# Patient Record
Sex: Female | Born: 1960 | Race: White | Hispanic: No | Smoking: Former smoker
Health system: Southern US, Community
[De-identification: ages and names within clinical notes are randomized; demographics above are authoritative.]

## PROBLEM LIST (undated history)

## (undated) DIAGNOSIS — M549 Dorsalgia, unspecified: Secondary | ICD-10-CM

## (undated) DIAGNOSIS — K760 Fatty (change of) liver, not elsewhere classified: Secondary | ICD-10-CM

## (undated) DIAGNOSIS — F32A Depression, unspecified: Secondary | ICD-10-CM

## (undated) DIAGNOSIS — M1990 Unspecified osteoarthritis, unspecified site: Secondary | ICD-10-CM

## (undated) DIAGNOSIS — N921 Excessive and frequent menstruation with irregular cycle: Secondary | ICD-10-CM

## (undated) DIAGNOSIS — F419 Anxiety disorder, unspecified: Secondary | ICD-10-CM

## (undated) DIAGNOSIS — N951 Menopausal and female climacteric states: Secondary | ICD-10-CM

## (undated) DIAGNOSIS — L918 Other hypertrophic disorders of the skin: Secondary | ICD-10-CM

## (undated) DIAGNOSIS — B019 Varicella without complication: Secondary | ICD-10-CM

## (undated) DIAGNOSIS — B079 Viral wart, unspecified: Secondary | ICD-10-CM

## (undated) DIAGNOSIS — Z5189 Encounter for other specified aftercare: Secondary | ICD-10-CM

## (undated) DIAGNOSIS — K219 Gastro-esophageal reflux disease without esophagitis: Secondary | ICD-10-CM

## (undated) DIAGNOSIS — F329 Major depressive disorder, single episode, unspecified: Secondary | ICD-10-CM

## (undated) DIAGNOSIS — J189 Pneumonia, unspecified organism: Secondary | ICD-10-CM

## (undated) DIAGNOSIS — E049 Nontoxic goiter, unspecified: Secondary | ICD-10-CM

## (undated) DIAGNOSIS — L570 Actinic keratosis: Secondary | ICD-10-CM

## (undated) DIAGNOSIS — R51 Headache: Secondary | ICD-10-CM

## (undated) DIAGNOSIS — R011 Cardiac murmur, unspecified: Secondary | ICD-10-CM

## (undated) DIAGNOSIS — R252 Cramp and spasm: Secondary | ICD-10-CM

## (undated) HISTORY — DX: Depression, unspecified: F32.A

## (undated) HISTORY — DX: Menopausal and female climacteric states: N95.1

## (undated) HISTORY — DX: Unspecified osteoarthritis, unspecified site: M19.90

## (undated) HISTORY — DX: Fatty (change of) liver, not elsewhere classified: K76.0

## (undated) HISTORY — DX: Actinic keratosis: L57.0

## (undated) HISTORY — DX: Dorsalgia, unspecified: M54.9

## (undated) HISTORY — DX: Cramp and spasm: R25.2

## (undated) HISTORY — DX: Other hypertrophic disorders of the skin: L91.8

## (undated) HISTORY — DX: Anxiety disorder, unspecified: F41.9

## (undated) HISTORY — DX: Viral wart, unspecified: B07.9

## (undated) HISTORY — DX: Excessive and frequent menstruation with irregular cycle: N92.1

## (undated) HISTORY — DX: Nontoxic goiter, unspecified: E04.9

## (undated) HISTORY — DX: Varicella without complication: B01.9

## (undated) HISTORY — DX: Major depressive disorder, single episode, unspecified: F32.9

## (undated) HISTORY — DX: Encounter for other specified aftercare: Z51.89

---
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
## 1993-03-31 HISTORY — PX: FOOT SURGERY: SHX648

## 1998-03-31 HISTORY — PX: TUBAL LIGATION: SHX77

## 2005-11-07 ENCOUNTER — Ambulatory Visit: Payer: Self-pay | Admitting: Internal Medicine

## 2005-12-17 ENCOUNTER — Other Ambulatory Visit: Admission: RE | Admit: 2005-12-17 | Discharge: 2005-12-17 | Payer: Self-pay | Admitting: Obstetrics and Gynecology

## 2006-01-09 ENCOUNTER — Encounter: Admission: RE | Admit: 2006-01-09 | Discharge: 2006-01-09 | Payer: Self-pay | Admitting: Obstetrics and Gynecology

## 2006-04-02 ENCOUNTER — Ambulatory Visit: Payer: Self-pay | Admitting: Internal Medicine

## 2006-06-30 ENCOUNTER — Ambulatory Visit: Payer: Self-pay | Admitting: Internal Medicine

## 2007-12-21 ENCOUNTER — Ambulatory Visit: Payer: Self-pay | Admitting: Internal Medicine

## 2008-01-05 DIAGNOSIS — N951 Menopausal and female climacteric states: Secondary | ICD-10-CM

## 2008-01-05 DIAGNOSIS — N921 Excessive and frequent menstruation with irregular cycle: Secondary | ICD-10-CM

## 2008-01-05 HISTORY — DX: Excessive and frequent menstruation with irregular cycle: N92.1

## 2008-01-05 HISTORY — DX: Menopausal and female climacteric states: N95.1

## 2008-02-03 ENCOUNTER — Ambulatory Visit: Payer: Self-pay | Admitting: Family Medicine

## 2008-02-03 DIAGNOSIS — F411 Generalized anxiety disorder: Secondary | ICD-10-CM | POA: Insufficient documentation

## 2008-02-03 DIAGNOSIS — F32A Depression, unspecified: Secondary | ICD-10-CM | POA: Insufficient documentation

## 2008-02-03 DIAGNOSIS — F419 Anxiety disorder, unspecified: Secondary | ICD-10-CM

## 2008-02-03 DIAGNOSIS — F329 Major depressive disorder, single episode, unspecified: Secondary | ICD-10-CM

## 2008-03-01 ENCOUNTER — Ambulatory Visit: Payer: Self-pay | Admitting: Internal Medicine

## 2008-05-10 ENCOUNTER — Ambulatory Visit: Payer: Self-pay | Admitting: Internal Medicine

## 2008-05-11 ENCOUNTER — Telehealth: Payer: Self-pay | Admitting: Internal Medicine

## 2008-05-11 ENCOUNTER — Ambulatory Visit: Payer: Self-pay | Admitting: Internal Medicine

## 2008-10-27 ENCOUNTER — Ambulatory Visit: Payer: Self-pay | Admitting: Internal Medicine

## 2008-10-30 ENCOUNTER — Encounter: Payer: Self-pay | Admitting: Internal Medicine

## 2008-11-06 ENCOUNTER — Ambulatory Visit: Payer: Self-pay | Admitting: Family Medicine

## 2008-11-06 LAB — CONVERTED CEMR LAB: Rapid Strep: NEGATIVE

## 2009-01-11 ENCOUNTER — Telehealth (INDEPENDENT_AMBULATORY_CARE_PROVIDER_SITE_OTHER): Payer: Self-pay | Admitting: *Deleted

## 2009-04-06 ENCOUNTER — Encounter (INDEPENDENT_AMBULATORY_CARE_PROVIDER_SITE_OTHER): Payer: Self-pay | Admitting: *Deleted

## 2009-04-06 ENCOUNTER — Telehealth (INDEPENDENT_AMBULATORY_CARE_PROVIDER_SITE_OTHER): Payer: Self-pay | Admitting: *Deleted

## 2009-04-06 ENCOUNTER — Telehealth: Payer: Self-pay | Admitting: Internal Medicine

## 2009-04-19 ENCOUNTER — Ambulatory Visit: Payer: Self-pay | Admitting: Internal Medicine

## 2009-07-06 ENCOUNTER — Telehealth (INDEPENDENT_AMBULATORY_CARE_PROVIDER_SITE_OTHER): Payer: Self-pay | Admitting: *Deleted

## 2009-09-03 ENCOUNTER — Ambulatory Visit: Payer: Self-pay | Admitting: Internal Medicine

## 2009-09-03 DIAGNOSIS — M549 Dorsalgia, unspecified: Secondary | ICD-10-CM | POA: Insufficient documentation

## 2009-09-25 ENCOUNTER — Encounter: Payer: Self-pay | Admitting: Internal Medicine

## 2009-10-23 ENCOUNTER — Encounter: Payer: Self-pay | Admitting: Internal Medicine

## 2009-10-31 ENCOUNTER — Encounter: Payer: Self-pay | Admitting: Internal Medicine

## 2009-11-23 ENCOUNTER — Telehealth: Payer: Self-pay | Admitting: Internal Medicine

## 2009-12-29 ENCOUNTER — Emergency Department (HOSPITAL_COMMUNITY): Admission: EM | Admit: 2009-12-29 | Discharge: 2009-12-29 | Payer: Self-pay | Admitting: Emergency Medicine

## 2009-12-31 ENCOUNTER — Ambulatory Visit: Payer: Self-pay | Admitting: Internal Medicine

## 2009-12-31 DIAGNOSIS — T07XXXA Unspecified multiple injuries, initial encounter: Secondary | ICD-10-CM | POA: Insufficient documentation

## 2010-01-14 ENCOUNTER — Telehealth: Payer: Self-pay | Admitting: Internal Medicine

## 2010-01-21 ENCOUNTER — Ambulatory Visit (HOSPITAL_COMMUNITY): Admission: RE | Admit: 2010-01-21 | Discharge: 2010-01-21 | Payer: Self-pay | Admitting: Sports Medicine

## 2010-01-22 ENCOUNTER — Telehealth: Payer: Self-pay | Admitting: Internal Medicine

## 2010-02-07 ENCOUNTER — Encounter: Payer: Self-pay | Admitting: Internal Medicine

## 2010-02-08 ENCOUNTER — Telehealth (INDEPENDENT_AMBULATORY_CARE_PROVIDER_SITE_OTHER): Payer: Self-pay | Admitting: *Deleted

## 2010-03-21 ENCOUNTER — Encounter: Payer: Self-pay | Admitting: Internal Medicine

## 2010-04-30 NOTE — Consult Note (Signed)
Summary: MRI-spinal stenosis, discussed options, plans a local injection  [facility]   Imported By: Lanelle Bal 11/07/2009 11:20:41  _____________________________________________________________________  External Attachment:    Type:   Image     Comment:   External Document

## 2010-04-30 NOTE — Consult Note (Signed)
Summary: RX MRI, back---[facility]  [facility]   Imported By: Lanelle Bal 10/12/2009 08:30:54  _____________________________________________________________________  External Attachment:    Type:   Image     Comment:   External Document

## 2010-04-30 NOTE — Letter (Signed)
Summary: Primary Care Appointment Letter  [facility] [place]  [address] [place], [place] [address]   Phone: [phone]  Fax: [phone]    04/06/2009 MRN: [id number]  [name] [name] [address] [place], [place]  [address]  Dear [name],   Your Primary Care Physician Kennebec E. Paz MD has indicated that:    ___X____it is time to schedule an appointment.    _______you missed your appointment on______ and need to call and          reschedule.    _______you need to have lab work done.    _______you need to schedule an appointment discuss lab or test results.    _______you need to call to reschedule your appointment that is                       scheduled on _________.     Please call our office as soon as possible. Our phone number is [phone]          _________. Please press option 1. Our office is open 8a-12noon and 1p-5p, Monday through Friday.     Thank you,     Primary Care Scheduler

## 2010-04-30 NOTE — Assessment & Plan Note (Signed)
Summary: 6 mos followup/med   Vital Signs:  Patient profile:   50 year old female Height:      64.5 inches Weight:      192.6 pounds Pulse rate:   60 / minute BP sitting:   110 / 80  Vitals Entered By: Shary Decamp (April 19, 2009 9:27 AM) CC: rov   History of Present Illness: ROV doing well emotionally   Allergies: 1)  ! * Almonds  Past History:  Past Medical History: Reviewed history from 05/10/2008 and no changes required. G1 P1 ANXIETY -DEPRESSIVE DISORDER     Past Surgical History: Reviewed history from 12/21/2007 and no changes required. Tubal ligation (2000) left foot surgery tracheostomy (1968) due to severe allergic rxn  Social History: Married 1 child 50 y/o Occupation: lost her job 03/2009  Tobacco--quit 2008 ETOH-- social   Review of Systems       sleeping well off birth control pills per her gynecologist, having mild hot flashes at night  Physical Exam  General:  alert and well-developed.   Lungs:  normal respiratory effort, no intercostal retractions, no accessory muscle use, and normal breath sounds.   Heart:  normal rate, regular rhythm, and no murmur.   Extremities:  no edema   Impression & Recommendations:  Problem # 1:  ANXIETY (ICD-300.00) doing well, stopping her birth control pills has not affect her mood no change return to the office in 6 to 8 months Her updated medication list for this problem includes:    Ativan 1 Mg Tabs (Lorazepam) .Marland Kitchen... 1/2-1 tab by mouth as needed for anxiety -    Fluoxetine Hcl 20 Mg Caps (Fluoxetine hcl) .Marland Kitchen... 1 by mouth once daily -  Problem # 2:  ROUTINE GENERAL MEDICAL EXAM@HEALTH  CARE FACL (ICD-V70.0) her gynecologist takes  care of her  physicals last Td ? within last 10 years  no flu shot-- explained the benefits, will call if interested  Complete Medication List: 1)  Ativan 1 Mg Tabs (Lorazepam) .... 1/2-1 tab by mouth as needed for anxiety - 2)  Fluoxetine Hcl 20 Mg Caps (Fluoxetine  hcl) .Marland Kitchen.. 1 by mouth once daily - 3)  Ketoconazole 2 % Crea (Ketoconazole) .... Ketoconazole 2% cream apply bid x7days  Patient Instructions: 1)  Please schedule a follow-up appointment in 6 to 8  months .

## 2010-04-30 NOTE — Consult Note (Signed)
Summary: [facility]  [facility]   Imported By: Lanelle Bal 02/20/2010 12:50:50  _____________________________________________________________________  External Attachment:    Type:   Image     Comment:   External Document

## 2010-04-30 NOTE — Progress Notes (Signed)
Summary: refill request  Phone Note Refill Request Message from:  Patient on November 23, 2009 11:49 AM  Refills Requested: Medication #1:  ATIVAN 1 MG TABS 1/2-1 tab by mouth as needed for anxiety -   Dosage confirmed as above?Dosage Confirmed   Supply Requested: 1 month [facility] [address]  Next Appointment Scheduled: none Initial call taken by: Lavell Islam,  November 23, 2009 11:49 AM  Follow-up for Phone Call        ok, see prescription Follow-up by: [facility] E. Paz MD,  November 23, 2009 4:41 PM    New/Updated Medications: ATIVAN 1 MG TABS (LORAZEPAM) 1/2-1 tab by mouth by mouth once a day  as needed for anxiety Prescriptions: ATIVAN 1 MG TABS (LORAZEPAM) 1/2-1 tab by mouth by mouth once a day  as needed for anxiety  #30 x 0   Entered by:   Army Fossa CMA   Authorized by:   Nolon Rod. Paz MD   Signed by:   Army Fossa CMA on 11/23/2009   Method used:   Printed then faxed to ...       [facility] [address] [phone]* (retail)       [address]       [place], [place]  [address]       Ph: [phone]       Fax: [phone]   RxID:   [id number]   Appended Document: refill request sent [address]

## 2010-04-30 NOTE — Assessment & Plan Note (Signed)
Summary: pain hip- leg/cbs   Vital Signs:  Patient profile:   51 year old female Height:      64.5 inches Weight:      196 pounds BMI:     33.24 Temp:     98.2 degrees F oral Pulse rate:   62 / minute BP sitting:   120 / 78  (left arm)  Vitals Entered By: Jeremy Johann CMA (September 03, 2009 3:35 PM) CC: pain in lef x65month Comments REVIEWED MED LIST, PATIENT AGREED DOSE AND INSTRUCTION CORRECT    History of Present Illness: has a long h/o mild back pain, pain increased since January along with on and off radiation to the left leg. The pain in the leg is posterior, going all the way from the hip to the heel, burning-like. at the present time, the back pain has decreased but the pain in the posterior left leg is more steady. Initially stretching and naproxen helped  Allergies: 1)  ! * Almonds  Past History:  Past Medical History: Reviewed history from 05/10/2008 and no changes required. G1 P1 ANXIETY -DEPRESSIVE DISORDER     Past Surgical History: Reviewed history from 12/21/2007 and no changes required. Tubal ligation (2000) left foot surgery tracheostomy (1968) due to severe allergic rxn  Social History: Reviewed history from 04/19/2009 and no changes required. Married 1 child   Occupation: lost her job 03/2009  Tobacco--quit 2008 ETOH-- social   Review of Systems       no fever No bladder or bowel incontinence No rash in the buttocks  Physical Exam  General:  alert and well-developed.   Extremities:  no lower extremity edema Neurologic:  lower extremity pinprick examination, reflexes and strength: Normal Straight leg test negative Gait normal   Impression & Recommendations:  Problem # 1:  BACK PAIN (ICD-724.5)  chronic on and off back pain, worse for the last few months with some radicular features. The patient is not interested in pain medication "I just like the pain to get better" plan: Orthopedic referral  Orders: Orthopedic Referral  (Ortho)  Complete Medication List: 1)  Ativan 1 Mg Tabs (Lorazepam) .... 1/2-1 tab by mouth as needed for anxiety - 2)  Fluoxetine Hcl 20 Mg Caps (Fluoxetine hcl) .Marland Kitchen.. 1 by mouth once daily - 3)  Ketoconazole 2 % Crea (Ketoconazole) .... Ketoconazole 2% cream apply bid x7days

## 2010-04-30 NOTE — Op Note (Signed)
Summary: Lumbar Epidural Steroid Injection/[facility]   Lumbar Epidural Steroid Injection/[facility]   Imported By: Lanelle Bal 11/12/2009 13:42:50  _____________________________________________________________________  External Attachment:    Type:   Image     Comment:   External Document

## 2010-04-30 NOTE — Progress Notes (Signed)
Summary: Pain in legs./kb  Phone Note Call from Patient Call back at Home Phone [phone]   Summary of Call: Patient left message on triage that she was seen and was advised to give her pain sometime. She states that she is still having a considerable amount of pain in her legs and would like to know if she should be seen here again or referred back to ortho. Please advise. Initial call taken by: Lucious Groves CMA,  January 14, 2010 9:51 AM  Follow-up for Phone Call        refer her back to the ortho she saw in the past Jose E. Paz MD  January 14, 2010 10:04 AM   Additional Follow-up for Phone Call Additional follow up Details #1::        Patient notified and will call their office. Additional Follow-up by: Lucious Groves CMA,  January 14, 2010 11:01 AM
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
## 2010-04-30 NOTE — Progress Notes (Signed)
Summary: FLUOXETINE HCL REFILL  Phone Note Refill Request Message from:  Fax from Pharmacy on February 08, 2010 1:01 PM  Refills Requested: Medication #1:  FLUOXETINE HCL 20 MG CAPS 1 by mouth once daily -   Last Refilled: 01/08/2010 [facility],   [address], FAX [phone]   QTY = 30  Initial call taken by: Jerolyn Shin,  February 08, 2010 1:03 PM    Prescriptions: FLUOXETINE HCL 20 MG CAPS (FLUOXETINE HCL) 1 by mouth once daily -  #30 x 3   Entered by:   Army Fossa CMA   Authorized by:   Nolon Rod. Paz MD   Signed by:   Army Fossa CMA on 02/08/2010   Method used:   Electronically to        [facility] [address] (retail)       [phone]       [place], [place]  [address]       Ph: [phone]       Fax: [phone]   RxID:   [id number]

## 2010-04-30 NOTE — Progress Notes (Signed)
  Phone Note Refill Request Message from:  Patient on April 06, 2009 4:32 PM  Refills Requested: Medication #1:  FLUOXETINE HCL 20 MG CAPS 1 by mouth once daily  Medication #2:  ATIVAN 1 MG TABS 1/2-1 tab by mouth as needed for anxiety   Last Refilled: 02/03/2008   Notes: #30 Patient requesting 90 day supply of fluoxetine because she will be losing insurance @ the end of this month.  Patient states that her ativan rx has expired.  Ok'd #90 on fluoxetine & #30 on ativan, pt is due ov....will make pt aware   [facility] [address] Next Appointment Scheduled: no pending appt Initial call taken by: Shary Decamp,  April 06, 2009 4:35 PM  Follow-up for Phone Call        agree Follow-up by: [facility] E. Dharma Pare MD,  April 06, 2009 4:43 PM    New/Updated Medications: ATIVAN 1 MG TABS (LORAZEPAM) 1/2-1 tab by mouth as needed for anxiety - DUE OFFICE VISIT FLUOXETINE HCL 20 MG CAPS (FLUOXETINE HCL) 1 by mouth once daily - DUE OFFICE VISIT BEFORE WE CAN AUTHORIZE ADDITIONAL REFILLS Prescriptions: FLUOXETINE HCL 20 MG CAPS (FLUOXETINE HCL) 1 by mouth once daily - DUE OFFICE VISIT BEFORE WE CAN AUTHORIZE ADDITIONAL REFILLS  #90 x 0   Entered by:   Shary Decamp   Authorized by:   Nolon Rod. Florice Hindle MD   Signed by:   Shary Decamp on 04/06/2009   Method used:   Printed then [facility]ed [facility] ...       [facility] [phone]* (retail)       [address]       [place], [place]  [address]       Ph: [phone]       Fax: [phone]   RxID:   [id number] ATIVAN 1 MG TABS (LORAZEPAM) 1/2-1 tab by mouth as needed for anxiety - DUE OFFICE VISIT  #30 x 0   Entered by:   Shary Decamp   Authorized by:   Nolon Rod. Jim Philemon MD   Signed by:   Shary Decamp on 04/06/2009   Method used:   Printed then faxed to ...       [facility] [phone]* (retail)       [address]       [place], [place]  [address]       Ph: [phone]       Fax: [phone]  RxID:   [id number]

## 2010-04-30 NOTE — Progress Notes (Signed)
Summary: refill  Phone Note Refill Request Message from:  Fax from Pharmacy on July 06, 2009 10:22 AM  Refills Requested: Medication #1:  FLUOXETINE HCL 20 MG CAPS 1 by mouth once daily - [facility]  [address] fax [phone]   Method Requested: Fax to Local Pharmacy Next Appointment Scheduled: no appt Initial call taken by: Barb Merino,  July 06, 2009 10:23 AM    Prescriptions: FLUOXETINE HCL 20 MG CAPS (FLUOXETINE HCL) 1 by mouth once daily -  #30 x 6   Entered by:   Shary Decamp   Authorized by:   Nolon Rod. Paz MD   Signed by:   Shary Decamp on 07/06/2009   Method used:   Electronically to        [facility] [phone]* (retail)       [address]       [place], [place]  [address]       Ph: [phone]       Fax: [phone]   RxID:   [id number]

## 2010-04-30 NOTE — Progress Notes (Signed)
Summary: OXYCODONE, CYCLOBENZIPRINE REFILLS  Phone Note Refill Request Call back at Home Phone [phone] Message from:  Patient on January 22, 2010 12:06 PM  Refills Requested: Medication #1:  PERCOCET 5-325 MG TABS q 6hrs as needed  Medication #2:  FLEXERIL 10 MG TABS one by mouth twice a day as needed. PATIENT STATES PHARMACY NEEDS PAPER PRESCRIPTIONS IN ORDER TO REFILL ---SHE WILL BE IN TOMORROW MORNING --WED 10/26--- TO PICK UP THESE PRESCRIPTIONS  Initial call taken by: Jerolyn Shin,  January 22, 2010 12:07 PM  Follow-up for Phone Call        noted [place] E. Paz MD  January 22, 2010 2:11 PM   Additional Follow-up for Phone Call Additional follow up Details #1::        okay to fill?  Additional Follow-up by: Army Fossa CMA,  January 24, 2010 7:51 AM    Additional Follow-up for Phone Call Additional follow up Details #2::    yes , #40 each Jose E. Paz MD  January 24, 2010 8:11 AM   Prescriptions: FLEXERIL 10 MG TABS (CYCLOBENZAPRINE HCL) one by mouth twice a day as needed  #40 x 0   Entered by:   Army Fossa CMA   Authorized by:   Nolon Rod. Paz MD   Signed by:   Army Fossa CMA on 01/24/2010   Method used:   Print then Give to Patient   RxID:   [id number] PERCOCET 5-325 MG TABS (OXYCODONE-ACETAMINOPHEN) q 6hrs as needed  #40 x 0   Entered by:   Army Fossa CMA   Authorized by:   Nolon Rod. Paz MD   Signed by:   Army Fossa CMA on 01/24/2010   Method used:   Print then Give to Patient   RxID:   [id number]

## 2010-04-30 NOTE — Progress Notes (Signed)
  Phone Note Refill Request Message from:  Patient  Refills Requested: Medication #1:  VITAMIN D  Medication #2:  ATIVAN 1 MG TABS 1/2-1 tab by mouth as needed for anxiety - DUE OFFICE VISIT pt called left msg can I get 90 instead of 30? [facility] [address]  Initial call taken by: Kandice Hams,  April 06, 2009 4:51 PM  Follow-up for Phone Call        spoke with pt informed rx for #90 of fluoxetine [facility] and #30 of ativan faxed.  Due for office visit, scheduled pt ov for this month January befrore ins expires Follow-up by: Kandice Hams,  April 06, 2009 4:57 PM

## 2010-05-02 NOTE — Consult Note (Signed)
Summary: [facility]   Imported By: Lanelle Bal 04/03/2010 09:40:13  _____________________________________________________________________  External Attachment:    Type:   Image     Comment:   External Document

## 2010-08-15 ENCOUNTER — Other Ambulatory Visit: Payer: Self-pay | Admitting: Internal Medicine

## 2010-08-16 NOTE — Assessment & Plan Note (Signed)
[facility]                          [place] OFFICE NOTE   [name] [name], [name] [name]                       MRN:          [id number]  DATE:11/07/2005                            DOB:          [birth year]    HISTORY AND PHYSICAL:   CHIEF COMPLAINT:  Left earache.   HISTORY OF PRESENT ILLNESS:  [name] [name] is a 50 year old white female who  came to the office because she developed earache about 5 days ago.  The pain  has spread somehow to her denture on the left side and to her neck, she  points to the left inframandibular area.  She reports that her husband had  what seems to be a cold last week.  She has been using ibuprofen and over-  the-counter decongestants with some help.   PAST MEDICAL HISTORY:  1. She is G1, P1, status post C-section.  2. Bilateral tubal ligation in 2000.  3. In 1968, when she was 50 years old, she had a SEVERE ALLERGIC REACTION.      She ended up needing tracheostomy.  The etiology of the reaction is not      certain, she suspects maybe was a reaction to a cat.  4. Status post left foot surgery.   FAMILY HISTORY:  1. Father passed.  He committed suicide.  2. Her mother is adopted, so she does not have a good reliable history      there.  3. To her knowledge, there is no history of colon or breast cancer,      diabetes or heart attacks.   SOCIAL HISTORY:  She smokes about 3/4 of a pack a day, she drinks socially.  She is married and has one child.   REVIEW OF SYSTEMS:  She denies any fever, sinus congestion or cough.  She  admits to some runny nose.   MEDICATIONS:  None.   ALLERGIES:  1. NO KNOWN DRUG ALLERGIES.  2. IS QUITE ALLERGIC TO ALMONDS.   PHYSICAL EXAM:  The patient is alert, oriented, in no apparent distress.  She weighs 153.2 pounds, blood pressure 130/80, pulse 64, respirations 14.  HEENT:  Ears - Both tympanic membranes are slightly bulged with clear fluid  underneath.  However, there is no  redness or discharge, no __________ sign.  The TMJs show no click.  Oropharynx is normal.  There is no evidence of any  dental infection or abscess by palpation.  Nose is clear.  Sinuses are not  tender.  LUNGS:  Clear to auscultation bilaterally.  CARDIOVASCULAR:  A regular rate and rhythm without a murmur.  NECK:  No lymphadenopathy.   ASSESSMENT AND PLAN:  1. The patient has otalgia.  I suspect she may have picked up a virus.  I      recommend her to continue with ibuprofen, decongestants and to let me      know if she is not better in a few days.  At this time, the physical      examination does not support any other diagnosis.  2. Offer her complete physical examination at her convenience.  Willow Ora, MD   JP/MedQ  DD:  11/07/2005  DT:  11/07/2005  Job #:  [id number]

## 2010-09-06 ENCOUNTER — Other Ambulatory Visit: Payer: Self-pay | Admitting: Internal Medicine

## 2010-09-06 NOTE — Telephone Encounter (Signed)
Pt is due for appt, please schedule.  

## 2010-09-10 NOTE — Telephone Encounter (Signed)
LMOM to call and schedule a followup appt with Dr Paz °

## 2010-09-11 ENCOUNTER — Telehealth: Payer: Self-pay | Admitting: Internal Medicine

## 2010-09-11 MED ORDER — FLUOXETINE HCL 20 MG PO CAPS: 20.0000 mg | ORAL_CAPSULE | Freq: Every day | ORAL | Status: DC

## 2010-09-11 NOTE — Telephone Encounter (Signed)
Pharm did not receive on 09/06/10.

## 2010-09-11 NOTE — Telephone Encounter (Signed)
Patient will have health ins in July - she has appt [phone] -- wants refill  prozac (generic) - [facility] [address] - patient aware she needs to keep appt

## 2010-09-24 NOTE — Telephone Encounter (Signed)
Patient has appt with Dr Drue Novel on 7/16 at 1:30

## 2010-10-14 ENCOUNTER — Ambulatory Visit (INDEPENDENT_AMBULATORY_CARE_PROVIDER_SITE_OTHER): Payer: BC Managed Care – PPO | Admitting: Internal Medicine

## 2010-10-14 ENCOUNTER — Encounter: Payer: Self-pay | Admitting: Internal Medicine

## 2010-10-14 DIAGNOSIS — F329 Major depressive disorder, single episode, unspecified: Secondary | ICD-10-CM

## 2010-10-14 DIAGNOSIS — M549 Dorsalgia, unspecified: Secondary | ICD-10-CM

## 2010-10-14 DIAGNOSIS — Z0000 Encounter for general adult medical examination without abnormal findings: Secondary | ICD-10-CM | POA: Insufficient documentation

## 2010-10-14 MED ORDER — FLUOXETINE HCL 20 MG PO CAPS: 20.0000 mg | ORAL_CAPSULE | Freq: Every day | ORAL | Status: DC

## 2010-10-14 NOTE — Progress Notes (Signed)
  Subjective:    Patient ID: [name] [name], female    DOB: July 18, 1960, 50 y.o.   MRN: [id number]  HPI Routine office visit, doing well Has gained wait, starting a new diet, ready to be more active   Past Medical History  Diagnosis Date  . Anxiety and depression   . Fatty liver     change, benign appearing 6 cm, cyst lower pole left kidney (incidental findings)   Past Surgical History  Procedure Date  . Tubal ligation 2000  . Foot surgery     left   . Tracheostomy 1968    due to severe allergic rxn      Review of Systems Anxiety and depression well controlled on current medications. Takes Ativan sporadically. She has chronic back pain, currently well-controlled, takes Percocet as needed.    Objective:   Physical Exam  Constitutional: She is oriented to person, place, and time. She appears well-developed and well-nourished.  Cardiovascular: Normal rate, regular rhythm and normal heart sounds.   No murmur heard. Pulmonary/Chest: Effort normal and breath sounds normal. No respiratory distress. She has no wheezes. She has no rales.  Musculoskeletal: She exhibits no edema.  Neurological: She is alert and oriented to person, place, and time.          Assessment & Plan:

## 2010-10-14 NOTE — Assessment & Plan Note (Signed)
Sees gynecology routinely but likes to schedule here a general checkup.

## 2010-10-14 NOTE — Assessment & Plan Note (Addendum)
Symptoms currently stable, takes percocet  only as needed, usually after a lot of physical activity. Will call if  RF needed

## 2010-10-14 NOTE — Assessment & Plan Note (Signed)
Well controlled, continue with present care 

## 2011-04-15 ENCOUNTER — Encounter: Payer: BC Managed Care – PPO | Admitting: Internal Medicine

## 2011-04-16 ENCOUNTER — Encounter: Payer: BC Managed Care – PPO | Admitting: Internal Medicine

## 2011-05-02 DIAGNOSIS — E049 Nontoxic goiter, unspecified: Secondary | ICD-10-CM

## 2011-05-02 HISTORY — DX: Nontoxic goiter, unspecified: E04.9

## 2011-05-07 ENCOUNTER — Ambulatory Visit (INDEPENDENT_AMBULATORY_CARE_PROVIDER_SITE_OTHER): Payer: PRIVATE HEALTH INSURANCE | Admitting: Internal Medicine

## 2011-05-07 ENCOUNTER — Encounter: Payer: Self-pay | Admitting: Internal Medicine

## 2011-05-07 ENCOUNTER — Telehealth: Payer: Self-pay | Admitting: Internal Medicine

## 2011-05-07 DIAGNOSIS — F419 Anxiety disorder, unspecified: Secondary | ICD-10-CM

## 2011-05-07 DIAGNOSIS — E010 Iodine-deficiency related diffuse (endemic) goiter: Secondary | ICD-10-CM

## 2011-05-07 DIAGNOSIS — E049 Nontoxic goiter, unspecified: Secondary | ICD-10-CM

## 2011-05-07 DIAGNOSIS — F32A Depression, unspecified: Secondary | ICD-10-CM

## 2011-05-07 DIAGNOSIS — Z0000 Encounter for general adult medical examination without abnormal findings: Secondary | ICD-10-CM

## 2011-05-07 LAB — COMPREHENSIVE METABOLIC PANEL
ALT: 30 U/L (ref 0–35)
CO2: 25 mEq/L (ref 19–32)
Calcium: 9.2 mg/dL (ref 8.4–10.5)
Chloride: 106 mEq/L (ref 96–112)
GFR: 115.51 mL/min (ref >60.00)
Sodium: 139 mEq/L (ref 135–145)
Total Protein: 7.5 g/dL (ref 6.0–8.3)

## 2011-05-07 LAB — CBC WITH DIFFERENTIAL/PLATELET
Basophils Relative: 0.6 % (ref 0.0–3.0)
Eosinophils Relative: 3.3 % (ref 0.0–5.0)
HCT: 40.1 % (ref 36.0–46.0)
Lymphs Abs: 1.5 10*3/uL (ref 0.7–4.0)
MCV: 96.1 fl (ref 78.0–100.0)
Monocytes Absolute: 0.6 10*3/uL (ref 0.1–1.0)
Neutrophils Relative %: 64.7 % (ref 43.0–77.0)
RBC: 4.17 Mil/uL (ref 3.87–5.11)
WBC: 6.6 10*3/uL (ref 4.5–10.5)

## 2011-05-07 LAB — T3, FREE: T3, Free: 2.9 pg/mL (ref 2.3–4.2)

## 2011-05-07 MED ORDER — LORAZEPAM 1 MG PO TABS: 0.5000 mg | ORAL_TABLET | Freq: Every day | ORAL | Status: DC | PRN

## 2011-05-07 NOTE — Patient Instructions (Signed)
Will schedule the ultrasound of the thyroid, please call if not schedule in 2 weeks  iFOB Call for refills

## 2011-05-07 NOTE — Progress Notes (Signed)
  Subjective:    Patient ID: [name] [name], female    DOB: 1960-04-25, 51 y.o.   MRN: [id number]  HPI CPX  Past Medical History: G1 P1 anxiety, depression CT 12-2009   fatty change of the liver, benign-appearing 6 cm cyst lower pole left kidney (incidental findings) Back pain, sporadic, rarely uses percocet  Past Surgical History: Tubal ligation (2000) left foot surgery tracheostomy (1968) due to severe allergic rxn  Family History: F - deceased suicide M - adopted - unknown family hx Lung cancer--M, smoker thyroid cancer-- M  Social History: Married, 1 child   Occupation: Librarian, academic for a seniors program Tobacco--quit 2008 ETOH-- social  Diet-- healthier than before! Lost weight in the last month Exercises every day  Review of Systems Denies fever, chills, has been losing weight due to a better lifestyle. No chest pain, shortness of breath, palpitations or edema. No cough, chest congestion. No nausea, vomiting, diarrhea. GERD well controlled as long as she takes her medication. Anxiety well controlled with current meds. Needs a refill of Ativan which she hasn't taken on time. Denies dysuria or gross hematuria.     Objective:   Physical Exam  Constitutional: She is oriented to person, place, and time. She appears well-developed and well-nourished. No distress.  HENT:  Head: Normocephalic and atraumatic.  Neck:       Enlarged R thyroid, smooth, no tender, no nodular, ~ 1.5 cm  Cardiovascular: Normal rate, regular rhythm and normal heart sounds.   No murmur heard. Pulmonary/Chest: Effort normal and breath sounds normal. No respiratory distress. She has no wheezes. She has no rales.  Abdominal: Soft. Bowel sounds are normal. She exhibits no distension. There is no tenderness. There is no rebound and no guarding.  Musculoskeletal: She exhibits no edema.  Neurological: She is alert and oriented to person, place, and time.  Skin: Skin is warm and dry. She is not  diaphoretic.  Psychiatric: She has a normal mood and affect. Her behavior is normal. Judgment and thought content normal.       Assessment & Plan:

## 2011-05-07 NOTE — Telephone Encounter (Signed)
The pt called and stated the dr has referred her to get a thyroid ultra sound.  She is hoping this can be done at [facility].  Thanks!

## 2011-05-07 NOTE — Assessment & Plan Note (Signed)
Td 01-2011 Flu shot 01-2011 Never had a cscope, unknown FH: IFOB provided  Sees gynecology routinely  Labs Cont w/ healthier life style!

## 2011-05-07 NOTE — Assessment & Plan Note (Addendum)
See physical exam Labs U/s

## 2011-05-07 NOTE — Assessment & Plan Note (Addendum)
RF ativan, she hardly uses but likes to have it just in case

## 2011-05-08 ENCOUNTER — Encounter: Payer: Self-pay | Admitting: *Deleted

## 2011-05-09 ENCOUNTER — Encounter: Payer: Self-pay | Admitting: Internal Medicine

## 2011-05-12 ENCOUNTER — Other Ambulatory Visit (HOSPITAL_BASED_OUTPATIENT_CLINIC_OR_DEPARTMENT_OTHER): Payer: PRIVATE HEALTH INSURANCE

## 2011-05-16 ENCOUNTER — Other Ambulatory Visit: Payer: PRIVATE HEALTH INSURANCE

## 2011-05-16 DIAGNOSIS — Z5181 Encounter for therapeutic drug level monitoring: Secondary | ICD-10-CM

## 2011-05-28 ENCOUNTER — Telehealth: Payer: Self-pay

## 2011-05-28 NOTE — Telephone Encounter (Signed)
Patient is requesting the results of her thyroid ultrasound from last week.  Please call.

## 2011-05-28 NOTE — Telephone Encounter (Signed)
I don't see the results, do you? if the test was on at [facility], please call them and get the report.

## 2011-05-29 ENCOUNTER — Telehealth: Payer: Self-pay | Admitting: Internal Medicine

## 2011-05-29 DIAGNOSIS — E041 Nontoxic single thyroid nodule: Secondary | ICD-10-CM

## 2011-05-29 NOTE — Telephone Encounter (Signed)
Ultrasound report reviewed, has a  3.1 cm right thyroid nodule LMOM : Needs a biopsy, I will refer to [facility] radiology here in town. call if she has further questions

## 2011-05-30 NOTE — Telephone Encounter (Signed)
LMVM for patient to call back regarding where she had ultrasound done.

## 2011-06-02 ENCOUNTER — Telehealth: Payer: Self-pay | Admitting: Emergency Medicine

## 2011-06-02 NOTE — Telephone Encounter (Signed)
LM AT DR J. PAZ OFFICE TO MAKE THEM AWARE THAT PT. IS SCHEDULED FOR THYROID BX AT [facility] PER PT REQUEST.

## 2011-06-20 ENCOUNTER — Telehealth: Payer: Self-pay | Admitting: Internal Medicine

## 2011-06-20 NOTE — Telephone Encounter (Signed)
Bx: non-neoplasic goiter Advise pt, Bx benign, re assess thyroid yearly, call if problems

## 2011-06-20 NOTE — Telephone Encounter (Signed)
LMOVM for pt to return call 

## 2011-06-23 NOTE — Telephone Encounter (Signed)
LMOVM for pt to return call 

## 2011-06-23 NOTE — Telephone Encounter (Signed)
Discussed with pt

## 2011-07-28 ENCOUNTER — Encounter: Payer: Self-pay | Admitting: Obstetrics and Gynecology

## 2011-07-28 ENCOUNTER — Ambulatory Visit (INDEPENDENT_AMBULATORY_CARE_PROVIDER_SITE_OTHER): Payer: PRIVATE HEALTH INSURANCE | Admitting: Obstetrics and Gynecology

## 2011-07-28 VITALS — BP 110/68 | HR 64 | Ht 64.0 in | Wt 182.0 lb

## 2011-07-28 DIAGNOSIS — N841 Polyp of cervix uteri: Secondary | ICD-10-CM

## 2011-07-28 DIAGNOSIS — Z124 Encounter for screening for malignant neoplasm of cervix: Secondary | ICD-10-CM

## 2011-07-28 DIAGNOSIS — Z01419 Encounter for gynecological examination (general) (routine) without abnormal findings: Secondary | ICD-10-CM

## 2011-07-28 NOTE — Progress Notes (Signed)
Last Pap:01/11/2009  WNL: Yes Regular Periods:no LMP: 05/2011; 3-4- cycles in 2012, heavy but not long  Monthly Breast exam:yes Tetanus<59yrs:yes Nl.Bladder Function:yes Daily BMs:yes Healthy Diet:yes Calcium:yes Mammogram:yes 02/09/2009 Exercise:yes Seatbelt: yes Abuse at home: no Stressful work:no Sigmoid-colonoscopy: no  Subjective:    [name] [name] is a 51 y.o. female, No obstetric history on file., who presents for an annual exam.     History   Social History  . Marital Status: Married    Spouse Name: N/A    Number of Children: N/A  . Years of Education: N/A   Social History Main Topics  . Smoking status: Former Games developer  . Smokeless tobacco: None  . Alcohol Use: Yes     social   . Drug Use: No  . Sexually Active: Yes -- Female partner(s)    Birth Control/ Protection: Surgical     BTL   Other Topics Concern  . None   Social History Narrative  . None    Menstrual cycle:   LMP: Patient's last menstrual period was 05/02/2011.           Cycle: flow is moderate and irregular (see below)  The following portions of the patient's history were reviewed and updated as appropriate: allergies, current medications, past family history, past medical history, past social history, past surgical history and problem list.  Review of Systems Pertinent items are noted in HPI. Breast:Negative for breast lump,nipple discharge or nipple retraction Gastrointestinal: Negative for abdominal pain, change in bowel habits or rectal bleeding Urinary:negative   Objective:    BP 110/68  Pulse 64  Ht 5\' 4"  (1.626 m)  Wt 182 lb (82.555 kg)  BMI 31.24 kg/m2  LMP 05/02/2011    Weight:  Wt Readings from Last 1 Encounters:  07/28/11 182 lb (82.555 kg)          BMI: Body mass index is 31.24 kg/(m^2).  General Appearance: Alert, appropriate appearance for age. No acute distress HEENT: Grossly normal Neck / Thyroid: Supple, no masses, nodes or enlargement Lungs: clear to auscultation  bilaterally Back: No CVA tenderness Breast Exam: No masses or nodes.No dimpling, nipple retraction or discharge. Cardiovascular: Regular rate and rhythm. S1, S2, no murmur Gastrointestinal: Soft, non-tender, no masses or organomegaly Pelvic Exam: External genitalia: Left labial inclusion cyst which is draining Vaginal: normal mucosa without prolapse or lesions Cervix: lesions, 3mm polyp Adnexa: normal bimanual exam Uterus: normal single, nontender Rectovaginal: normal rectal, no masses Lymphatic Exam: Non-palpable nodes in neck, clavicular, axillary, or inguinal regions Skin: no rash or abnormalities Neurologic: Normal gait and speech, no tremor  Psychiatric: Alert and oriented, appropriate affect.   Wet Prep:not applicable Urinalysis:not applicable UPT: Not done   Assessment:    Perimenopausal bleeding pattern   Cervical polyp, asymptomatic Plan:    mammogram pap smear RTO 1 month for cervical polyp removal STD screening: not done Contraception:bilateral tubal ligation Moist heat to vulva      HAYGOOD,VANESSA PMD

## 2011-07-28 NOTE — Patient Instructions (Addendum)
  1.Patient Education Materials to be provided at check out (*indicates is located in Garment/textile technologist):  Cervical disorders (cervical polyp0 2.  Moist heat to vulva

## 2011-07-30 LAB — PAP IG AND HPV HIGH-RISK: HPV DNA High Risk: NOT DETECTED

## 2011-08-14 ENCOUNTER — Other Ambulatory Visit: Payer: Self-pay | Admitting: Internal Medicine

## 2011-08-14 NOTE — Telephone Encounter (Signed)
Refill done.  

## 2011-08-27 ENCOUNTER — Encounter: Payer: Self-pay | Admitting: Obstetrics and Gynecology

## 2011-08-27 ENCOUNTER — Telehealth: Payer: Self-pay | Admitting: *Deleted

## 2011-08-27 ENCOUNTER — Ambulatory Visit (INDEPENDENT_AMBULATORY_CARE_PROVIDER_SITE_OTHER): Payer: PRIVATE HEALTH INSURANCE | Admitting: Obstetrics and Gynecology

## 2011-08-27 VITALS — BP 110/60 | Ht 64.5 in | Wt 177.0 lb

## 2011-08-27 DIAGNOSIS — Z01419 Encounter for gynecological examination (general) (routine) without abnormal findings: Secondary | ICD-10-CM

## 2011-08-27 DIAGNOSIS — N841 Polyp of cervix uteri: Secondary | ICD-10-CM

## 2011-08-27 MED ORDER — OXYCODONE-ACETAMINOPHEN 5-325 MG PO TABS: 1.0000 | ORAL_TABLET | Freq: Four times a day (QID) | ORAL | Status: DC | PRN

## 2011-08-27 NOTE — Telephone Encounter (Signed)
Refill request for oxycodone 5-325mg . Ok to refill?

## 2011-08-27 NOTE — Telephone Encounter (Signed)
Refill done.  

## 2011-08-27 NOTE — Telephone Encounter (Signed)
40, no RF 

## 2011-08-27 NOTE — Progress Notes (Signed)
Removal of cervical polyp Pap nl 3mm non bleeding polyp in cervix  VS:  BP 110/60  Ht 5' 4.5" (1.638 m)  Wt 177 lb (80.287 kg)  BMI 29.91 kg/m2  Procedure: Polyp removed without difficulty AgNO3 to endocervical canal after ECC done Specimen to pathology F/U in 1 yr for aex or prn Call in 1 wk for results

## 2011-08-29 LAB — PATHOLOGY

## 2011-09-02 ENCOUNTER — Telehealth: Payer: Self-pay

## 2011-09-02 NOTE — Telephone Encounter (Signed)
Tc to pt per cervical polyp biopsy=normal. Pt voices understanding.

## 2011-10-22 ENCOUNTER — Encounter: Payer: Self-pay | Admitting: Obstetrics and Gynecology

## 2012-05-22 ENCOUNTER — Telehealth: Payer: Self-pay | Admitting: Internal Medicine

## 2012-05-24 ENCOUNTER — Encounter: Payer: Self-pay | Admitting: *Deleted

## 2012-05-24 NOTE — Telephone Encounter (Signed)
Letter mailed

## 2012-05-24 NOTE — Telephone Encounter (Signed)
Ok to refill? Last OV 2.6.13 Last filled 2.6.13

## 2012-05-24 NOTE — Telephone Encounter (Signed)
Rx done,send pt a letter, due for a OV

## 2012-08-13 ENCOUNTER — Encounter: Payer: Self-pay | Admitting: Family Medicine

## 2012-08-13 ENCOUNTER — Ambulatory Visit (INDEPENDENT_AMBULATORY_CARE_PROVIDER_SITE_OTHER): Payer: PRIVATE HEALTH INSURANCE | Admitting: Family Medicine

## 2012-08-13 VITALS — BP 110/70 | HR 80 | Temp 98.1°F | Ht 63.0 in | Wt 167.6 lb

## 2012-08-13 DIAGNOSIS — M4120 Other idiopathic scoliosis, site unspecified: Secondary | ICD-10-CM

## 2012-08-13 DIAGNOSIS — M62830 Muscle spasm of back: Secondary | ICD-10-CM

## 2012-08-13 DIAGNOSIS — M5380 Other specified dorsopathies, site unspecified: Secondary | ICD-10-CM

## 2012-08-13 MED ORDER — CYCLOBENZAPRINE HCL 5 MG PO TABS: 5.0000 mg | ORAL_TABLET | Freq: Three times a day (TID) | ORAL | Status: DC | PRN

## 2012-08-13 MED ORDER — NAPROXEN 500 MG PO TABS: 500.0000 mg | ORAL_TABLET | Freq: Two times a day (BID) | ORAL | Status: DC

## 2012-08-13 NOTE — Patient Instructions (Addendum)
Start the Naproxen twice daily for the inflammation- take w/ food.  Regularly x7-10 days and then as needed Start the flexeril for the spasm- this may cause drowsiness.  Increase to 2 tabs at bedtime for sleep and spasm HEAT!!! Gentle stretching Call with any questions or concerns Hang in there! Try and have a great weekend!

## 2012-08-13 NOTE — Progress Notes (Signed)
  Subjective:    Patient ID: [name] [name], female    DOB: [birth year], 52 y.o.   MRN: [id number]  HPI Back spasm- 'long hx of back problems.  A lot of stenosis and scoliosis and in the last week, a lot of muscle spasm'.  Not typical for pt.  'R lower flank'.  sxs started Sunday.  No relief w/ vicodin- 'it just makes me sleep'.  Some relief w/ ice, heat, stretching.  Will tighten up w/ sitting.  Difficulty turning.  No change in activity level recently.  No dysuria, frequency, urgency.   Review of Systems For ROS see HPI     Objective:   Physical Exam  Vitals reviewed. Constitutional: She is oriented to person, place, and time. She appears well-developed and well-nourished. No distress.  Musculoskeletal:  (-) SLR bilaterally Pain w/ forward flexion, no pain w/ extension + R lumbar paraspinal spasm + scoliosis  Neurological: She is alert and oriented to person, place, and time. She has normal reflexes. No cranial nerve deficit. Coordination normal.  Skin: Skin is warm and dry.  Psychiatric: She has a normal mood and affect. Her behavior is normal. Thought content normal.          Assessment & Plan:

## 2012-08-15 NOTE — Assessment & Plan Note (Signed)
New to provider.  Chronic problem for pt.  Likely due to pt's known scoliosis.  Scheduled NSAIDs, muscle relaxers.  Refer to neurosurg as pt has been seeing ortho and would like a 2nd opinion.  Reviewed supportive care and red flags that should prompt return.  Pt expressed understanding and is in agreement w/ plan.

## 2012-08-18 ENCOUNTER — Other Ambulatory Visit: Payer: Self-pay | Admitting: Internal Medicine

## 2012-08-18 NOTE — Telephone Encounter (Signed)
Pt has not been seen within a year. Letter was mailed to pt to make aware she is overdue for an OV. Pt does not have a future appt.  OK to refill?

## 2012-08-18 NOTE — Telephone Encounter (Signed)
Call patient, schedule a routine office visit. Okay to call one-month supply, no refills; no further refills without office visit

## 2012-08-18 NOTE — Telephone Encounter (Signed)
Left detailed msg on pt's vmail. Refill done.  

## 2012-09-03 ENCOUNTER — Encounter: Payer: Self-pay | Admitting: Lab

## 2012-09-06 ENCOUNTER — Encounter: Payer: Self-pay | Admitting: Internal Medicine

## 2012-09-06 ENCOUNTER — Ambulatory Visit (INDEPENDENT_AMBULATORY_CARE_PROVIDER_SITE_OTHER): Payer: PRIVATE HEALTH INSURANCE | Admitting: Internal Medicine

## 2012-09-06 VITALS — BP 110/72 | HR 74 | Temp 98.1°F | Wt 167.0 lb

## 2012-09-06 DIAGNOSIS — Z0000 Encounter for general adult medical examination without abnormal findings: Secondary | ICD-10-CM

## 2012-09-06 DIAGNOSIS — M549 Dorsalgia, unspecified: Secondary | ICD-10-CM

## 2012-09-06 DIAGNOSIS — F329 Major depressive disorder, single episode, unspecified: Secondary | ICD-10-CM

## 2012-09-06 DIAGNOSIS — E010 Iodine-deficiency related diffuse (endemic) goiter: Secondary | ICD-10-CM

## 2012-09-06 DIAGNOSIS — F32A Depression, unspecified: Secondary | ICD-10-CM

## 2012-09-06 MED ORDER — KETOCONAZOLE 2 % EX CREA: TOPICAL_CREAM | Freq: Every day | CUTANEOUS | Status: DC

## 2012-09-06 MED ORDER — LORAZEPAM 1 MG PO TABS: ORAL_TABLET | ORAL | Status: DC

## 2012-09-06 MED ORDER — FLUOXETINE HCL 20 MG PO CAPS: ORAL_CAPSULE | ORAL | Status: DC

## 2012-09-06 NOTE — Assessment & Plan Note (Signed)
Status post biopsy 06/09/2011,non malignant goiter Physical exam  is stable today. Plan: Ultrasound next year to check stability

## 2012-09-06 NOTE — Patient Instructions (Addendum)
Come back fasting at your earliest convenience, please make an appointment: FLP, CMP, TSH, CBC--- dx V70 ---- Use the cream as prescribed for 2 weeks, call if not better ---- Next visit in 6 months

## 2012-09-06 NOTE — Assessment & Plan Note (Signed)
Ongoing problems, has seen 2 orthopedic surgeons, neurology and will see Dr. Venetia Maxon. Ongoing pain and right foot numbness.

## 2012-09-06 NOTE — Assessment & Plan Note (Addendum)
Td 01-2011 Never had a cscope, unknown FH: IFOB neg last year another provided today. Plans to go for a cscope next year Due to [facility] and see  Gynecology, plans to call and make her own appointments Labs Diet exercise discussed

## 2012-09-06 NOTE — Progress Notes (Signed)
  Subjective:    Patient ID: [name] [name], female    DOB: Dec 09, 1960, 52 y.o.   MRN: [id number]  HPI CPX  Past Medical History  Diagnosis Date  . Anxiety and depression   . Fatty liver     CT 12-2009   fatty change of the liver,  benign appearing 6 cm, cyst lower pole left kidney (incidental findings)  . Varicella   . Arthritis     hips,knees, & back  . Menometrorrhagia 01/05/08  . Perimenopausal 01/05/08  . Goiter 05/2011  . Back pain    Past Surgical History  Procedure Laterality Date  . Foot surgery  1995    left   . 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 Tonsillectomy  1969  . Adnoids  1969  . Cesarean section  1999  . Tubal ligation  2000    bilateral   History   Social History  . Marital Status: Married    Spouse Name: N/A    Number of Children: 1  . Years of Education: N/A   Occupational History  . Librarian, academic for a seniors program    Social History Main Topics  . Smoking status: Former Games developer  . Smokeless tobacco: Never Used     Comment: quit 2008, used to snoke 1 ppd  . Alcohol Use: Yes     Comment: social   . Drug Use: No  . Sexually Active: Yes -- Female partner(s)    Birth Control/ Protection: Surgical     Comment: BTL   Other Topics Concern  . Not on file   Social History Narrative   Married, 1 child                  Family History  Problem Relation Age of Onset  . Adopted: Yes  . Cancer Mother     lung and thyroid    Review of Systems Unable to exercise d/t back pain, not doing well w/ diet. Having a lot of problems with back pain, see assessment and plan. Has a rash on the right foot, not better with antifungals OTC, complains of itching. No chest pain or shortness or breath No nausea, vomiting, diarrhea or blood in the stools. Good compliance with Prilosec and abdomen, needs a prescription, symptoms well-controlled.    Objective:   Physical Exam  Musculoskeletal:       Feet:   BP 110/72  Pulse 74   Temp(Src) 98.1 F (36.7 C) (Oral)  Wt 167 lb (75.751 kg)  BMI 29.59 kg/m2  SpO2 95%  LMP 05/02/2011  General -- alert, well-developed, NAD.   Neck --Enlarged R thyroid, smooth, no tender, no nodular, ~ 1.5 cm  Lungs -- normal respiratory effort, no intercostal retractions, no accessory muscle use, and normal breath sounds.   Heart-- normal rate, regular rhythm, no murmur, and no gallop.   Abdomen--soft, non-tender, no distention, no masses, no HSM, no guarding, and no rigidity.   Extremities-- no pretibial edema bilaterally  Neurologic-- alert & oriented X3 and strength normal in all extremities. Psych-- Cognition and judgment appear intact. Alert and cooperative with normal attention span and concentration.  not anxious appearing and not depressed appearing.       Assessment & Plan:

## 2012-09-06 NOTE — Assessment & Plan Note (Signed)
Well-controlled, refill medications, followup in 6 months.

## 2012-09-07 ENCOUNTER — Other Ambulatory Visit (INDEPENDENT_AMBULATORY_CARE_PROVIDER_SITE_OTHER): Payer: PRIVATE HEALTH INSURANCE

## 2012-09-07 DIAGNOSIS — Z0000 Encounter for general adult medical examination without abnormal findings: Secondary | ICD-10-CM

## 2012-09-07 LAB — TSH: TSH: 1.04 u[IU]/mL (ref 0.35–5.50)

## 2012-09-07 LAB — CBC WITH DIFFERENTIAL/PLATELET
Basophils Absolute: 0 10*3/uL (ref 0.0–0.1)
Eosinophils Relative: 4.9 % (ref 0.0–5.0)
HCT: 38 % (ref 36.0–46.0)
Hemoglobin: 12.9 g/dL (ref 12.0–15.0)
Lymphs Abs: 1.6 10*3/uL (ref 0.7–4.0)
MCV: 94.8 fl (ref 78.0–100.0)
Monocytes Absolute: 0.4 10*3/uL (ref 0.1–1.0)
Neutro Abs: 3.1 10*3/uL (ref 1.4–7.7)
Platelets: 271 10*3/uL (ref 150.0–400.0)
RDW: 12 % (ref 11.5–14.6)

## 2012-09-07 LAB — COMPREHENSIVE METABOLIC PANEL
ALT: 16 U/L (ref 0–35)
Alkaline Phosphatase: 69 U/L (ref 39–117)
Sodium: 141 mEq/L (ref 135–145)
Total Bilirubin: 0.5 mg/dL (ref 0.3–1.2)
Total Protein: 7 g/dL (ref 6.0–8.3)

## 2012-09-07 LAB — LIPID PANEL
Total CHOL/HDL Ratio: 4
VLDL: 27.4 mg/dL (ref 0.0–40.0)

## 2012-09-13 ENCOUNTER — Encounter: Payer: Self-pay | Admitting: *Deleted

## 2012-09-20 ENCOUNTER — Encounter: Payer: Self-pay | Admitting: Internal Medicine

## 2012-09-29 ENCOUNTER — Other Ambulatory Visit: Payer: Self-pay | Admitting: Neurosurgery

## 2012-10-19 ENCOUNTER — Telehealth: Payer: Self-pay | Admitting: Surgery

## 2012-10-19 NOTE — Telephone Encounter (Signed)
Message copied by Jena Gauss on Tue Oct 19, 2012  2:43 PM ------      Message from: Phillips Odor      Created: Tue Oct 19, 2012 11:48 AM      Regarding: appt. for consult w/ Dr. Myra Gianotti       Needs new patient appt. with Dr. Myra Gianotti, prior to surgery on  11/11/12(prior to ALIF with Dr. Venetia Maxon.)  Remind pt. to bring copy of L-S spine film with her to appt.   ------

## 2012-10-20 ENCOUNTER — Other Ambulatory Visit: Payer: Self-pay

## 2012-10-28 ENCOUNTER — Encounter (HOSPITAL_COMMUNITY): Payer: Self-pay | Admitting: Pharmacy Technician

## 2012-11-03 NOTE — Pre-Procedure Instructions (Signed)
[name] [name]  11/03/2012   Your procedure is scheduled on:  Thurs, Aug 14 @ 7:30 AM  Report to [facility] at 5:30 AM.  Call this number if you have problems the morning of surgery: [phone]   Remember:   Do not eat food or drink liquids after midnight.   Take these medicines the morning of surgery with A SIP OF WATER: Fluoxetine(Prozac),Pain Pill(if needed),Claritin(Loratadine),Ativan(Lorazepam),and Omeprazole(Prilosec)               Stop taking your Naproxen 7 Days prior to surgery.No Goody's,BC's,Ibuprofen,Aspirin,Fish Oil,or any Herbal Medications   Do not wear jewelry, make-up or nail polish.  Do not wear lotions, powders, or perfumes. You may wear deodorant.  Do not shave 48 hours prior to surgery.   Do not bring valuables to the hospital.  [facility] is not responsible                   for any belongings or valuables.  Contacts, dentures or bridgework may not be worn into surgery.  Leave suitcase in the car. After surgery it may be brought to your room.  For patients admitted to the hospital, checkout time is 11:00 AM the day of  discharge.     Special Instructions: Shower using CHG 2 nights before surgery and the night before surgery.  If you shower the day of surgery use CHG.  Use special wash - you have one bottle of CHG for all showers.  You should use approximately 1/3 of the bottle for each shower.   Please read over the following fact sheets that you were given: Pain Booklet, Coughing and Deep Breathing, Blood Transfusion Information, MRSA Information and Surgical Site Infection Prevention

## 2012-11-04 ENCOUNTER — Encounter (HOSPITAL_COMMUNITY): Payer: Self-pay

## 2012-11-04 ENCOUNTER — Encounter (HOSPITAL_COMMUNITY)
Admission: RE | Admit: 2012-11-04 | Discharge: 2012-11-04 | Disposition: A | Discharge status: Home / Self Care | Payer: PRIVATE HEALTH INSURANCE | Source: Ambulatory Visit | Attending: Anesthesiology | Admitting: Anesthesiology

## 2012-11-04 ENCOUNTER — Encounter (HOSPITAL_COMMUNITY)
Admission: RE | Admit: 2012-11-04 | Discharge: 2012-11-04 | Disposition: A | Discharge status: Home / Self Care | Payer: PRIVATE HEALTH INSURANCE | Source: Ambulatory Visit | Attending: Neurosurgery | Admitting: Neurosurgery

## 2012-11-04 DIAGNOSIS — Z01818 Encounter for other preprocedural examination: Secondary | ICD-10-CM | POA: Insufficient documentation

## 2012-11-04 DIAGNOSIS — Z01812 Encounter for preprocedural laboratory examination: Secondary | ICD-10-CM | POA: Insufficient documentation

## 2012-11-04 DIAGNOSIS — Z01810 Encounter for preprocedural cardiovascular examination: Secondary | ICD-10-CM | POA: Insufficient documentation

## 2012-11-04 HISTORY — DX: Headache: R51

## 2012-11-04 HISTORY — DX: Cardiac murmur, unspecified: R01.1

## 2012-11-04 HISTORY — DX: Gastro-esophageal reflux disease without esophagitis: K21.9

## 2012-11-04 HISTORY — DX: Pneumonia, unspecified organism: J18.9

## 2012-11-04 LAB — COMPREHENSIVE METABOLIC PANEL
AST: 20 U/L (ref 0–37)
BUN: 14 mg/dL (ref 6–23)
CO2: 29 mEq/L (ref 19–32)
Calcium: 9.8 mg/dL (ref 8.4–10.5)
Chloride: 104 mEq/L (ref 96–112)
Creatinine, Ser: 0.66 mg/dL (ref 0.50–1.10)
GFR calc Af Amer: >90 mL/min (ref >90)
GFR calc non Af Amer: >90 mL/min (ref >90)
Glucose, Bld: 67 mg/dL — ABNORMAL LOW (ref 70–99)
Total Bilirubin: 0.3 mg/dL (ref 0.3–1.2)

## 2012-11-04 LAB — CBC
HCT: 38.7 % (ref 36.0–46.0)
Hemoglobin: 14.1 g/dL (ref 12.0–15.0)
MCH: 33.1 pg (ref 26.0–34.0)
MCV: 90.8 fL (ref 78.0–100.0)
RBC: 4.26 MIL/uL (ref 3.87–5.11)
WBC: 5.5 10*3/uL (ref 4.0–10.5)

## 2012-11-04 LAB — TYPE AND SCREEN: Antibody Screen: NEGATIVE

## 2012-11-04 NOTE — Progress Notes (Signed)
[name] [name] at dr. Fredrich Birks office notified to call in prescription for mupirocin for positive nasal swab.

## 2012-11-04 NOTE — Progress Notes (Signed)
Primary physician - dr. Drue Novel Does not have a cardiologist No prior cardiac testing

## 2012-11-05 ENCOUNTER — Encounter: Payer: Self-pay | Admitting: Surgery

## 2012-11-08 ENCOUNTER — Encounter: Payer: Self-pay | Admitting: Surgery

## 2012-11-08 ENCOUNTER — Ambulatory Visit (INDEPENDENT_AMBULATORY_CARE_PROVIDER_SITE_OTHER): Payer: PRIVATE HEALTH INSURANCE | Admitting: Surgery

## 2012-11-08 VITALS — BP 129/83 | HR 62 | Ht 63.0 in | Wt 167.4 lb

## 2012-11-08 DIAGNOSIS — IMO0002 Reserved for concepts with insufficient information to code with codable children: Secondary | ICD-10-CM

## 2012-11-08 NOTE — Progress Notes (Signed)
[facility]   Patient name: [name] [name] MRN: [id number] DOB: April 04, 1960 Sex: female   Referred by: Dr. Venetia Maxon  Reason for referral:  Chief Complaint  Patient presents with  . New Evaluation    ALIF 11/11/2012 - Dr. Venetia Maxon     HISTORY OF PRESENT ILLNESS: This is a 52 year old female who is referred for preoperative counseling prior to anterior exposure for the L5-S1 disc space. The patient's surgery is scheduled for this Thursday. She is also going to undergo XLIF of L1-L5. Patient reports many year history of back pain. This has progressed over time. She is having trouble with numbness and weakness in both of her legs.  The patient suffers from mild gastroesophageal reflux disease and has a history of depression, both which he is on medications for. She has a history of smoking but quit several years ago. Her only abdominal surgeries have been a tubal ligation and C-section.  Past Medical History  Diagnosis Date  . Anxiety and depression   . Fatty liver     CT 12-2009   fatty change of the liver,  benign appearing 6 cm, cyst lower pole left kidney (incidental findings)  . Varicella   . Arthritis     hips,knees, & back  . Menometrorrhagia 01/05/08  . Perimenopausal 01/05/08  . Goiter 05/2011  . Back pain   . Heart murmur   . Anxiety   . Pneumonia     hx of  . GERD (gastroesophageal reflux disease)   . FAOZHYQM(578.4)     Past Surgical History  Procedure Laterality Date  . Foot surgery  1995    left   . 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 Tonsillectomy  1969  . Adnoids  1969  . Cesarean section  1999  . Tubal ligation  2000    bilateral    History   Social History  . Marital Status: Married    Spouse Name: N/A    Number of Children: 1  . Years of Education: N/A   Occupational History  . Librarian, academic for a seniors program    Social History Main Topics  . Smoking status: Former Smoker    Quit date:  11/09/2006  . Smokeless tobacco: Never Used     Comment: quit 2008, used to snoke 1 ppd  . Alcohol Use: 0.0 oz/week    3-4 Glasses of wine per week     Comment: social   . Drug Use: No  . Sexually Active: Yes -- Female partner(s)    Birth Control/ Protection: Surgical     Comment: BTL   Other Topics Concern  . Not on file   Social History Narrative   Married, 1 child                   Family History  Problem Relation Age of Onset  . Adopted: Yes  . Cancer Mother     lung and thyroid  . Cancer Father     Allergies as of 11/08/2012 - Review Complete 11/08/2012  Allergen Reaction Noted  . Almond extract  01/05/2008    Current Outpatient Prescriptions on File Prior to Visit  Medication Sig Dispense Refill  . cyclobenzaprine (FLEXERIL) 5 MG tablet Take 5 mg by mouth 3 (three) times daily as needed for muscle spasms.      Marland Kitchen FLUoxetine (PROZAC) 20 MG capsule Take 20 mg by mouth daily.      Marland Kitchen  HYDROcodone-acetaminophen (NORCO/VICODIN) 5-325 MG per tablet Take 1 tablet by mouth every 6 (six) hours as needed for pain.      Marland Kitchen ketoconazole (NIZORAL) 2 % cream Apply 1 application topically as needed.       . loratadine (CLARITIN) 10 MG tablet Take 10 mg by mouth daily.      Marland Kitchen LORazepam (ATIVAN) 1 MG tablet Take 0.5-1 mg by mouth every 8 (eight) hours as needed for anxiety.      . Multiple Vitamin (MULTIVITAMIN) tablet Take 1 tablet by mouth daily.      . naproxen (NAPROSYN) 500 MG tablet Take 500 mg by mouth 2 (two) times daily as needed (pain).       Marland Kitchen omeprazole (PRILOSEC OTC) 20 MG tablet Take 20 mg by mouth daily.      Marland Kitchen oxyCODONE-acetaminophen (PERCOCET/ROXICET) 5-325 MG per tablet Take 1 tablet by mouth every 6 (six) hours as needed for pain.       No current facility-administered medications on file prior to visit.     REVIEW OF SYSTEMS: Please see history of present illness, otherwise all systems negative  PHYSICAL EXAMINATION: General: The patient appears their stated  age.  Vital signs are BP 129/83  Pulse 62  Ht 5\' 3"  (1.6 m)  Wt 167 lb 6.4 oz (75.932 kg)  BMI 29.66 kg/m2  SpO2 100%  LMP 05/02/2011 HEENT:  No gross abnormalities Pulmonary: Respirations are non-labored Abdomen: Soft and non-tender  Musculoskeletal: There are no major deformities.   Neurologic: No focal weakness or paresthesias are detected, Skin: There are no ulcer or rashes noted. Psychiatric: The patient has normal affect. Cardiovascular: There is a regular rate and rhythm without significant murmur appreciated. Palpable pedal pulses bilaterally. No carotid bruits.   Assessment:  Degenerative back disease Plan: I discussed proceeding with anterior exposure of the L5-S1 disc space. We discussed the incision and potential wound complications. Also discussed injury to the artery nerve and ureter. All her questions were answered. Her surgery has been scheduled for this Thursday.     Jorge Ny, M.D. [facility] [place] Office: [phone] Pager:  [phone]

## 2012-11-10 MED ORDER — CEFAZOLIN SODIUM-DEXTROSE 2-3 GM-% IV SOLR
2.0000 g | INTRAVENOUS | Status: AC
  Administered 2012-11-11 (×2): 2 g via INTRAVENOUS
  Filled 2012-11-10: qty 50
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
## 2012-11-11 ENCOUNTER — Encounter (HOSPITAL_COMMUNITY)
Admission: RE | Disposition: A | Discharge status: Home Health | Payer: Self-pay | Source: Ambulatory Visit | Attending: Neurosurgery

## 2012-11-11 ENCOUNTER — Encounter (HOSPITAL_COMMUNITY): Payer: Self-pay | Admitting: Anesthesiology

## 2012-11-11 ENCOUNTER — Ambulatory Visit (HOSPITAL_COMMUNITY): Payer: PRIVATE HEALTH INSURANCE

## 2012-11-11 ENCOUNTER — Inpatient Hospital Stay (HOSPITAL_COMMUNITY)
Admission: RE | Admit: 2012-11-11 | Discharge: 2012-11-16 | DRG: 455 | Disposition: A | Discharge status: Home Health | Payer: PRIVATE HEALTH INSURANCE | Source: Ambulatory Visit | Attending: Neurosurgery | Admitting: Neurosurgery

## 2012-11-11 ENCOUNTER — Ambulatory Visit (HOSPITAL_COMMUNITY): Payer: PRIVATE HEALTH INSURANCE | Admitting: Anesthesiology

## 2012-11-11 DIAGNOSIS — R339 Retention of urine, unspecified: Secondary | ICD-10-CM | POA: Diagnosis not present

## 2012-11-11 DIAGNOSIS — M4000 Postural kyphosis, site unspecified: Secondary | ICD-10-CM | POA: Diagnosis present

## 2012-11-11 DIAGNOSIS — M549 Dorsalgia, unspecified: Secondary | ICD-10-CM

## 2012-11-11 DIAGNOSIS — M4040 Postural lordosis, site unspecified: Secondary | ICD-10-CM | POA: Diagnosis present

## 2012-11-11 DIAGNOSIS — M47817 Spondylosis without myelopathy or radiculopathy, lumbosacral region: Secondary | ICD-10-CM | POA: Diagnosis present

## 2012-11-11 DIAGNOSIS — Z79899 Other long term (current) drug therapy: Secondary | ICD-10-CM

## 2012-11-11 DIAGNOSIS — M62830 Muscle spasm of back: Secondary | ICD-10-CM

## 2012-11-11 DIAGNOSIS — M4120 Other idiopathic scoliosis, site unspecified: Principal | ICD-10-CM | POA: Diagnosis present

## 2012-11-11 DIAGNOSIS — Z87891 Personal history of nicotine dependence: Secondary | ICD-10-CM

## 2012-11-11 DIAGNOSIS — F329 Major depressive disorder, single episode, unspecified: Secondary | ICD-10-CM | POA: Diagnosis present

## 2012-11-11 DIAGNOSIS — F3289 Other specified depressive episodes: Secondary | ICD-10-CM | POA: Diagnosis present

## 2012-11-11 DIAGNOSIS — K219 Gastro-esophageal reflux disease without esophagitis: Secondary | ICD-10-CM | POA: Diagnosis present

## 2012-11-11 DIAGNOSIS — IMO0002 Reserved for concepts with insufficient information to code with codable children: Secondary | ICD-10-CM

## 2012-11-11 HISTORY — PX: ANTERIOR LUMBAR FUSION: SHX1170

## 2012-11-11 HISTORY — PX: ABDOMINAL EXPOSURE: SHX5708

## 2012-11-11 HISTORY — PX: ANTERIOR LAT LUMBAR FUSION: SHX1168

## 2012-11-11 SURGERY — ANTERIOR LUMBAR FUSION 1 LEVEL
Anesthesia: General | Site: Flank | Laterality: Right | Wound class: Clean

## 2012-11-11 MED ORDER — DOCUSATE SODIUM 100 MG PO CAPS
100.0000 mg | ORAL_CAPSULE | Freq: Two times a day (BID) | ORAL | Status: DC
  Administered 2012-11-11 – 2012-11-15 (×8): 100 mg via ORAL
  Filled 2012-11-11 (×10): qty 1

## 2012-11-11 MED ORDER — 0.9 % SODIUM CHLORIDE (POUR BTL) OPTIME
TOPICAL | Status: DC | PRN
  Administered 2012-11-11 (×2): 1000 mL

## 2012-11-11 MED ORDER — KETOCONAZOLE 2 % EX CREA
1.0000 "application " | TOPICAL_CREAM | Freq: Two times a day (BID) | CUTANEOUS | Status: DC | PRN
  Filled 2012-11-11: qty 15

## 2012-11-11 MED ORDER — MENTHOL 3 MG MT LOZG: 1.0000 | LOZENGE | OROMUCOSAL | Status: DC | PRN

## 2012-11-11 MED ORDER — HYDROCODONE-ACETAMINOPHEN 5-325 MG PO TABS: 1.0000 | ORAL_TABLET | ORAL | Status: DC | PRN

## 2012-11-11 MED ORDER — SODIUM CHLORIDE 0.9 % IJ SOLN
3.0000 mL | Freq: Two times a day (BID) | INTRAMUSCULAR | Status: DC
  Administered 2012-11-11 – 2012-11-15 (×3): 3 mL via INTRAVENOUS

## 2012-11-11 MED ORDER — THROMBIN 5000 UNITS EX SOLR
CUTANEOUS | Status: DC | PRN
  Administered 2012-11-11 (×2): 5000 [IU] via TOPICAL

## 2012-11-11 MED ORDER — PHENOL 1.4 % MT LIQD: 1.0000 | OROMUCOSAL | Status: DC | PRN

## 2012-11-11 MED ORDER — DIAZEPAM 5 MG PO TABS
ORAL_TABLET | ORAL | Status: AC
  Filled 2012-11-11: qty 1

## 2012-11-11 MED ORDER — ONDANSETRON HCL 4 MG/2ML IJ SOLN: 4.0000 mg | Freq: Once | INTRAMUSCULAR | Status: DC | PRN

## 2012-11-11 MED ORDER — ONE-DAILY MULTI VITAMINS PO TABS: 1.0000 | ORAL_TABLET | Freq: Every day | ORAL | Status: DC

## 2012-11-11 MED ORDER — LORATADINE 10 MG PO TABS
10.0000 mg | ORAL_TABLET | Freq: Every day | ORAL | Status: DC
  Administered 2012-11-13 – 2012-11-16 (×4): 10 mg via ORAL
  Filled 2012-11-11 (×5): qty 1

## 2012-11-11 MED ORDER — DEXAMETHASONE SODIUM PHOSPHATE 4 MG/ML IJ SOLN
INTRAMUSCULAR | Status: DC | PRN
  Administered 2012-11-11: 12 mg via INTRAVENOUS

## 2012-11-11 MED ORDER — SODIUM CHLORIDE 0.9 % IV SOLN
10.0000 mg | INTRAVENOUS | Status: DC | PRN
  Administered 2012-11-11: 40 ug/min via INTRAVENOUS

## 2012-11-11 MED ORDER — OMEPRAZOLE MAGNESIUM 20 MG PO TBEC: 20.0000 mg | DELAYED_RELEASE_TABLET | Freq: Every day | ORAL | Status: DC

## 2012-11-11 MED ORDER — PHENYLEPHRINE HCL 10 MG/ML IJ SOLN
INTRAMUSCULAR | Status: DC | PRN
  Administered 2012-11-11 (×3): 80 ug via INTRAVENOUS

## 2012-11-11 MED ORDER — ARTIFICIAL TEARS OP OINT
TOPICAL_OINTMENT | OPHTHALMIC | Status: DC | PRN
  Administered 2012-11-11: 1 via OPHTHALMIC

## 2012-11-11 MED ORDER — BISACODYL 10 MG RE SUPP: 10.0000 mg | Freq: Every day | RECTAL | Status: DC | PRN

## 2012-11-11 MED ORDER — NAPROXEN 500 MG PO TABS
500.0000 mg | ORAL_TABLET | Freq: Two times a day (BID) | ORAL | Status: DC | PRN
  Filled 2012-11-11: qty 1

## 2012-11-11 MED ORDER — ZOLPIDEM TARTRATE 5 MG PO TABS: 5.0000 mg | ORAL_TABLET | Freq: Every evening | ORAL | Status: DC | PRN

## 2012-11-11 MED ORDER — LIDOCAINE-EPINEPHRINE 1 %-1:100000 IJ SOLN
INTRAMUSCULAR | Status: DC | PRN
  Administered 2012-11-11: 6 mL

## 2012-11-11 MED ORDER — HYDROCODONE-ACETAMINOPHEN 5-325 MG PO TABS
1.0000 | ORAL_TABLET | ORAL | Status: DC | PRN
  Administered 2012-11-13 – 2012-11-14 (×3): 2 via ORAL
  Filled 2012-11-11 (×3): qty 2

## 2012-11-11 MED ORDER — ONDANSETRON HCL 4 MG/2ML IJ SOLN
INTRAMUSCULAR | Status: DC | PRN
  Administered 2012-11-11: 4 mg via INTRAVENOUS

## 2012-11-11 MED ORDER — MORPHINE SULFATE 2 MG/ML IJ SOLN
1.0000 mg | INTRAMUSCULAR | Status: DC | PRN
  Administered 2012-11-11 – 2012-11-12 (×3): 4 mg via INTRAVENOUS
  Administered 2012-11-12: 2 mg via INTRAVENOUS
  Filled 2012-11-11: qty 1
  Filled 2012-11-11 (×4): qty 2

## 2012-11-11 MED ORDER — HYDROMORPHONE HCL PF 1 MG/ML IJ SOLN
INTRAMUSCULAR | Status: AC
  Filled 2012-11-11: qty 1

## 2012-11-11 MED ORDER — PROPOFOL 10 MG/ML IV BOLUS
INTRAVENOUS | Status: DC | PRN
  Administered 2012-11-11: 200 mg via INTRAVENOUS

## 2012-11-11 MED ORDER — ALBUMIN HUMAN 5 % IV SOLN
INTRAVENOUS | Status: DC | PRN
  Administered 2012-11-11 (×2): via INTRAVENOUS

## 2012-11-11 MED ORDER — HYDROMORPHONE HCL PF 1 MG/ML IJ SOLN
0.2500 mg | INTRAMUSCULAR | Status: DC | PRN
  Administered 2012-11-11 (×2): 0.5 mg via INTRAVENOUS

## 2012-11-11 MED ORDER — FLUOXETINE HCL 20 MG PO CAPS
20.0000 mg | ORAL_CAPSULE | Freq: Every day | ORAL | Status: DC
  Administered 2012-11-13 – 2012-11-16 (×4): 20 mg via ORAL
  Filled 2012-11-11 (×5): qty 1

## 2012-11-11 MED ORDER — BUPIVACAINE HCL (PF) 0.5 % IJ SOLN
INTRAMUSCULAR | Status: DC | PRN
  Administered 2012-11-11: 6 mL

## 2012-11-11 MED ORDER — FENTANYL CITRATE 0.05 MG/ML IJ SOLN
INTRAMUSCULAR | Status: DC | PRN
  Administered 2012-11-11 (×2): 100 ug via INTRAVENOUS
  Administered 2012-11-11 (×3): 150 ug via INTRAVENOUS
  Administered 2012-11-11: 100 ug via INTRAVENOUS
  Administered 2012-11-11: 50 ug via INTRAVENOUS
  Administered 2012-11-11: 100 ug via INTRAVENOUS

## 2012-11-11 MED ORDER — LACTATED RINGERS IV SOLN
INTRAVENOUS | Status: DC | PRN
  Administered 2012-11-11 (×2): via INTRAVENOUS

## 2012-11-11 MED ORDER — OXYCODONE-ACETAMINOPHEN 5-325 MG PO TABS
1.0000 | ORAL_TABLET | ORAL | Status: DC | PRN
  Administered 2012-11-11 – 2012-11-12 (×3): 2 via ORAL
  Filled 2012-11-11 (×6): qty 2

## 2012-11-11 MED ORDER — ONDANSETRON HCL 4 MG/2ML IJ SOLN: 4.0000 mg | INTRAMUSCULAR | Status: DC | PRN

## 2012-11-11 MED ORDER — POLYETHYLENE GLYCOL 3350 17 G PO PACK
17.0000 g | PACK | Freq: Every day | ORAL | Status: DC | PRN
  Filled 2012-11-11 (×2): qty 1

## 2012-11-11 MED ORDER — PANTOPRAZOLE SODIUM 40 MG IV SOLR
40.0000 mg | Freq: Every day | INTRAVENOUS | Status: DC
  Administered 2012-11-11 – 2012-11-12 (×2): 40 mg via INTRAVENOUS
  Filled 2012-11-11 (×3): qty 40

## 2012-11-11 MED ORDER — SODIUM CHLORIDE 0.9 % IV SOLN: 250.0000 mL | INTRAVENOUS | Status: DC

## 2012-11-11 MED ORDER — ADULT MULTIVITAMIN W/MINERALS CH
1.0000 | ORAL_TABLET | Freq: Every day | ORAL | Status: DC
  Administered 2012-11-13 – 2012-11-14 (×2): 1 via ORAL
  Administered 2012-11-15: 10:00:00 via ORAL
  Administered 2012-11-16: 1 via ORAL
  Filled 2012-11-11 (×5): qty 1

## 2012-11-11 MED ORDER — ACETAMINOPHEN 650 MG RE SUPP: 650.0000 mg | RECTAL | Status: DC | PRN

## 2012-11-11 MED ORDER — KCL IN DEXTROSE-NACL 20-5-0.45 MEQ/L-%-% IV SOLN
INTRAVENOUS | Status: DC
  Administered 2012-11-11 – 2012-11-12 (×2): via INTRAVENOUS
  Filled 2012-11-11 (×3): qty 1000

## 2012-11-11 MED ORDER — DIAZEPAM 5 MG PO TABS
5.0000 mg | ORAL_TABLET | Freq: Four times a day (QID) | ORAL | Status: DC | PRN
  Administered 2012-11-11 – 2012-11-16 (×10): 5 mg via ORAL
  Filled 2012-11-11 (×8): qty 1

## 2012-11-11 MED ORDER — ACETAMINOPHEN 325 MG PO TABS: 650.0000 mg | ORAL_TABLET | ORAL | Status: DC | PRN

## 2012-11-11 MED ORDER — HEMOSTATIC AGENTS (NO CHARGE) OPTIME
TOPICAL | Status: DC | PRN
  Administered 2012-11-11: 1 via TOPICAL

## 2012-11-11 MED ORDER — HYDROMORPHONE HCL PF 1 MG/ML IJ SOLN: 0.2500 mg | INTRAMUSCULAR | Status: DC | PRN

## 2012-11-11 MED ORDER — CEFAZOLIN SODIUM 1-5 GM-% IV SOLN
1.0000 g | Freq: Three times a day (TID) | INTRAVENOUS | Status: AC
  Administered 2012-11-11 – 2012-11-12 (×2): 1 g via INTRAVENOUS
  Filled 2012-11-11 (×2): qty 50

## 2012-11-11 MED ORDER — SENNA 8.6 MG PO TABS
1.0000 | ORAL_TABLET | Freq: Two times a day (BID) | ORAL | Status: DC
  Administered 2012-11-11 – 2012-11-16 (×9): 8.6 mg via ORAL
  Filled 2012-11-11 (×12): qty 1

## 2012-11-11 MED ORDER — CEFAZOLIN SODIUM 1-5 GM-% IV SOLN
INTRAVENOUS | Status: AC
  Filled 2012-11-11: qty 50

## 2012-11-11 MED ORDER — CEFAZOLIN SODIUM-DEXTROSE 2-3 GM-% IV SOLR
INTRAVENOUS | Status: AC
  Filled 2012-11-11: qty 50

## 2012-11-11 MED ORDER — LORAZEPAM 0.5 MG PO TABS: 0.5000 mg | ORAL_TABLET | Freq: Three times a day (TID) | ORAL | Status: DC | PRN

## 2012-11-11 MED ORDER — ROCURONIUM BROMIDE 100 MG/10ML IV SOLN
INTRAVENOUS | Status: DC | PRN
  Administered 2012-11-11: 30 mg via INTRAVENOUS
  Administered 2012-11-11: 20 mg via INTRAVENOUS
  Administered 2012-11-11: 30 mg via INTRAVENOUS
  Administered 2012-11-11: 10 mg via INTRAVENOUS

## 2012-11-11 MED ORDER — LACTATED RINGERS IV SOLN
INTRAVENOUS | Status: DC | PRN
  Administered 2012-11-11 (×2): via INTRAVENOUS

## 2012-11-11 MED ORDER — MIDAZOLAM HCL 5 MG/5ML IJ SOLN
INTRAMUSCULAR | Status: DC | PRN
  Administered 2012-11-11: 2 mg via INTRAVENOUS

## 2012-11-11 MED ORDER — FLEET ENEMA 7-19 GM/118ML RE ENEM: 1.0000 | ENEMA | Freq: Once | RECTAL | Status: AC | PRN

## 2012-11-11 MED ORDER — LIDOCAINE HCL (CARDIAC) 20 MG/ML IV SOLN
INTRAVENOUS | Status: DC | PRN
  Administered 2012-11-11: 60 mg via INTRAVENOUS

## 2012-11-11 MED ORDER — CYCLOBENZAPRINE HCL 10 MG PO TABS
5.0000 mg | ORAL_TABLET | Freq: Three times a day (TID) | ORAL | Status: DC | PRN
  Administered 2012-11-11 – 2012-11-14 (×4): 5 mg via ORAL
  Filled 2012-11-11 (×2): qty 1
  Filled 2012-11-11: qty 2
  Filled 2012-11-11: qty 1

## 2012-11-11 MED ORDER — ALUM & MAG HYDROXIDE-SIMETH 200-200-20 MG/5ML PO SUSP: 30.0000 mL | Freq: Four times a day (QID) | ORAL | Status: DC | PRN

## 2012-11-11 MED ORDER — SODIUM CHLORIDE 0.9 % IJ SOLN: 3.0000 mL | INTRAMUSCULAR | Status: DC | PRN

## 2012-11-11 MED ORDER — OXYCODONE-ACETAMINOPHEN 5-325 MG PO TABS: 1.0000 | ORAL_TABLET | ORAL | Status: DC | PRN

## 2012-11-11 SURGICAL SUPPLY — 127 items
ADH SKN CLS APL DERMABOND .7 (GAUZE/BANDAGES/DRESSINGS)
ADH SKN CLS LQ APL DERMABOND (GAUZE/BANDAGES/DRESSINGS) ×4
APPLIER CLIP 11 MED OPEN (CLIP)
APR CLP MED 11 20 MLT OPN (CLIP)
BAG DECANTER FOR FLEXI CONT (MISCELLANEOUS) ×2 IMPLANT
BLADE SURG ROTATE 9660 (MISCELLANEOUS) ×2 IMPLANT
BONE VOID FILLER STRIP 10CC (Bone Implant) ×3 IMPLANT
BUR BARREL STRAIGHT FLUTE 4.0 (BURR) IMPLANT
CAGE ALIF 12H 7DEGREE (Cage) ×1 IMPLANT
CANISTER SUCTION 2500CC (MISCELLANEOUS) ×3 IMPLANT
CLIP APPLIE 11 MED OPEN (CLIP) ×4 IMPLANT
CLIP TI MEDIUM 24 (CLIP) ×2 IMPLANT
CLIP TI WIDE RED SMALL 24 (CLIP) ×2 IMPLANT
CLOTH BEACON ORANGE TIMEOUT ST (SAFETY) ×8 IMPLANT
CONT SPEC 4OZ CLIKSEAL STRL BL (MISCELLANEOUS) ×3 IMPLANT
CORENT WIDE 10X22X55 (Orthopedic Implant) ×3 IMPLANT
COROENT WIDE 10X22X55 (Orthopedic Implant) IMPLANT
COROENT XL-W 10X22X50 (Orthopedic Implant) ×1 IMPLANT
COROENT XL-W 8X22X50 (Orthopedic Implant) ×2 IMPLANT
COVER BACK TABLE 24X17X13 BIG (DRAPES) ×1 IMPLANT
COVER TABLE BACK 60X90 (DRAPES) ×2 IMPLANT
DERMABOND ADHESIVE PROPEN (GAUZE/BANDAGES/DRESSINGS) ×2
DERMABOND ADVANCED (GAUZE/BANDAGES/DRESSINGS)
DERMABOND ADVANCED .7 DNX12 (GAUZE/BANDAGES/DRESSINGS) ×8 IMPLANT
DERMABOND ADVANCED .7 DNX6 (GAUZE/BANDAGES/DRESSINGS) IMPLANT
DRAPE C-ARM 42X72 X-RAY (DRAPES) ×9 IMPLANT
DRAPE C-ARMOR (DRAPES) ×3 IMPLANT
DRAPE INCISE IOBAN 66X45 STRL (DRAPES) ×3 IMPLANT
DRAPE LAPAROTOMY 100X72X124 (DRAPES) ×6 IMPLANT
DRAPE POUCH INSTRU U-SHP 10X18 (DRAPES) ×6 IMPLANT
DRESSING TELFA 8X3 (GAUZE/BANDAGES/DRESSINGS) IMPLANT
DURAPREP 26ML APPLICATOR (WOUND CARE) ×6 IMPLANT
ELECT BLADE 4.0 EZ CLEAN MEGAD (MISCELLANEOUS) ×6
ELECT REM PT RETURN 9FT ADLT (ELECTROSURGICAL) ×6
ELECTRODE BLDE 4.0 EZ CLN MEGD (MISCELLANEOUS) ×4 IMPLANT
ELECTRODE REM PT RTRN 9FT ADLT (ELECTROSURGICAL) ×4 IMPLANT
GAUZE SPONGE 4X4 16PLY XRAY LF (GAUZE/BANDAGES/DRESSINGS) IMPLANT
GLOVE BIO SURGEON STRL SZ8 (GLOVE) ×8 IMPLANT
GLOVE BIOGEL PI IND STRL 7.0 (GLOVE) IMPLANT
GLOVE BIOGEL PI IND STRL 7.5 (GLOVE) ×2 IMPLANT
GLOVE BIOGEL PI IND STRL 8 (GLOVE) ×2 IMPLANT
GLOVE BIOGEL PI IND STRL 8.5 (GLOVE) ×4 IMPLANT
GLOVE BIOGEL PI INDICATOR 7.0 (GLOVE) ×1
GLOVE BIOGEL PI INDICATOR 7.5 (GLOVE) ×1
GLOVE BIOGEL PI INDICATOR 8 (GLOVE) ×3
GLOVE BIOGEL PI INDICATOR 8.5 (GLOVE) ×2
GLOVE ECLIPSE 7.5 STRL STRAW (GLOVE) ×3 IMPLANT
GLOVE ECLIPSE 8.0 STRL XLNG CF (GLOVE) ×6 IMPLANT
GLOVE EXAM NITRILE LRG STRL (GLOVE) ×3 IMPLANT
GLOVE EXAM NITRILE MD LF STRL (GLOVE) IMPLANT
GLOVE EXAM NITRILE XL STR (GLOVE) IMPLANT
GLOVE EXAM NITRILE XS STR PU (GLOVE) IMPLANT
GLOVE SURG SS PI 7.0 STRL IVOR (GLOVE) ×3 IMPLANT
GLOVE SURG SS PI 7.5 STRL IVOR (GLOVE) ×3 IMPLANT
GOWN BRE IMP SLV AUR LG STRL (GOWN DISPOSABLE) IMPLANT
GOWN BRE IMP SLV AUR XL STRL (GOWN DISPOSABLE) ×9 IMPLANT
GOWN PREVENTION PLUS XXLARGE (GOWN DISPOSABLE) ×2 IMPLANT
GOWN STRL NON-REIN LRG LVL3 (GOWN DISPOSABLE) ×3 IMPLANT
GOWN STRL REIN 2XL LVL4 (GOWN DISPOSABLE) ×4 IMPLANT
HEMOSTAT SURGICEL 2X14 (HEMOSTASIS) IMPLANT
INSERT FOGARTY 61MM (MISCELLANEOUS) IMPLANT
INSERT FOGARTY SM (MISCELLANEOUS) IMPLANT
KIT BASIN OR (CUSTOM PROCEDURE TRAY) ×6 IMPLANT
KIT DILATOR XLIF 5 (KITS) IMPLANT
KIT INFUSE LRG II (Orthopedic Implant) ×1 IMPLANT
KIT MAXCESS (KITS) ×1 IMPLANT
KIT NDL NVM5 EMG ELECT (KITS) IMPLANT
KIT NEEDLE NVM5 EMG ELECT (KITS) ×2 IMPLANT
KIT NEEDLE NVM5 EMG ELECTRODE (KITS) ×1
KIT ROOM TURNOVER OR (KITS) ×8 IMPLANT
KIT XLIF (KITS) ×1
LOOP VESSEL MAXI BLUE (MISCELLANEOUS) ×2 IMPLANT
LOOP VESSEL MINI RED (MISCELLANEOUS) ×2 IMPLANT
NDL HYPO 25X1 1.5 SAFETY (NEEDLE) ×2 IMPLANT
NDL SPNL 18GX3.5 QUINCKE PK (NEEDLE) ×2 IMPLANT
NEEDLE BONE MARROW 8GAX6 (NEEDLE) ×1 IMPLANT
NEEDLE HYPO 25X1 1.5 SAFETY (NEEDLE) ×3 IMPLANT
NEEDLE SPNL 18GX3.5 QUINCKE PK (NEEDLE) ×3 IMPLANT
NS IRRIG 1000ML POUR BTL (IV SOLUTION) ×6 IMPLANT
PACK LAMINECTOMY NEURO (CUSTOM PROCEDURE TRAY) ×6 IMPLANT
PAD ARMBOARD 7.5X6 YLW CONV (MISCELLANEOUS) ×10 IMPLANT
PLATE ALIF SPINAL 14MM (Plate) ×1 IMPLANT
SCREW 25MM (Screw) ×6 IMPLANT
SCREW 30MM (Screw) ×2 IMPLANT
SCREW BN 25X6XNS SPNE ALIF (Screw) IMPLANT
SPONGE GAUZE 4X4 12PLY (GAUZE/BANDAGES/DRESSINGS) IMPLANT
SPONGE INTESTINAL PEANUT (DISPOSABLE) ×14 IMPLANT
SPONGE LAP 18X18 X RAY DECT (DISPOSABLE) ×3 IMPLANT
SPONGE LAP 4X18 X RAY DECT (DISPOSABLE) IMPLANT
SPONGE SURGIFOAM ABS GEL 100 (HEMOSTASIS) IMPLANT
SPONGE SURGIFOAM ABS GEL SZ50 (HEMOSTASIS) ×1 IMPLANT
STAPLER SKIN PROX WIDE 3.9 (STAPLE) ×4 IMPLANT
STAPLER VISISTAT 35W (STAPLE) IMPLANT
SUT MNCRL AB 4-0 PS2 18 (SUTURE) ×2 IMPLANT
SUT PDS AB 1 CTX 36 (SUTURE) ×2 IMPLANT
SUT PROLENE 4 0 RB 1 (SUTURE)
SUT PROLENE 4-0 RB1 .5 CRCL 36 (SUTURE) ×8 IMPLANT
SUT PROLENE 5 0 CC1 (SUTURE) IMPLANT
SUT PROLENE 6 0 C 1 30 (SUTURE) ×2 IMPLANT
SUT PROLENE 6 0 CC (SUTURE) IMPLANT
SUT SILK 0 TIES 10X30 (SUTURE) ×3 IMPLANT
SUT SILK 2 0 TIES 10X30 (SUTURE) ×3 IMPLANT
SUT SILK 2 0 TIES 17X18 (SUTURE)
SUT SILK 2 0SH CR/8 30 (SUTURE) IMPLANT
SUT SILK 2-0 18XBRD TIE BLK (SUTURE) ×2 IMPLANT
SUT SILK 3 0 TIES 10X30 (SUTURE) ×2 IMPLANT
SUT SILK 3 0SH CR/8 30 (SUTURE) IMPLANT
SUT VIC AB 0 CT1 27 (SUTURE) ×6
SUT VIC AB 0 CT1 27XBRD ANBCTR (SUTURE) ×6 IMPLANT
SUT VIC AB 1 CT1 18XBRD ANBCTR (SUTURE) ×4 IMPLANT
SUT VIC AB 1 CT1 8-18 (SUTURE) ×6
SUT VIC AB 2-0 CT1 18 (SUTURE) ×7 IMPLANT
SUT VIC AB 2-0 CT1 27 (SUTURE)
SUT VIC AB 2-0 CT1 27XBRD (SUTURE) ×2 IMPLANT
SUT VIC AB 2-0 CT1 36 (SUTURE) ×2 IMPLANT
SUT VIC AB 3-0 SH 27 (SUTURE)
SUT VIC AB 3-0 SH 27X BRD (SUTURE) ×2 IMPLANT
SUT VIC AB 3-0 SH 8-18 (SUTURE) ×8 IMPLANT
SUT VICRYL 4-0 PS2 18IN ABS (SUTURE) IMPLANT
SYR 20ML ECCENTRIC (SYRINGE) ×6 IMPLANT
SYR CONTROL 10ML LL (SYRINGE) ×1 IMPLANT
TAPE CLOTH 3X10 TAN LF (GAUZE/BANDAGES/DRESSINGS) ×8 IMPLANT
TOWEL OR 17X24 6PK STRL BLUE (TOWEL DISPOSABLE) ×8 IMPLANT
TOWEL OR 17X26 10 PK STRL BLUE (TOWEL DISPOSABLE) ×8 IMPLANT
TRAP SPECIMEN MUCOUS 40CC (MISCELLANEOUS) ×2 IMPLANT
TRAY FOLEY CATH 14FRSI W/METER (CATHETERS) ×5 IMPLANT
WATER STERILE IRR 1000ML POUR (IV SOLUTION) ×6 IMPLANT

## 2012-11-11 NOTE — Op Note (Signed)
[facility] [place]  Patient name: [name] [name] MRN: [id number] DOB: 08-07-1960 Sex: female  11/11/2012 Pre-operative Diagnosis: Degenerative back disease Post-operative diagnosis:  Same Surgeon:  Jorge Ny Procedure:   Anterior exposure of the L5-S1 disc space Anesthesia:  General Blood Loss:  See anesthesia record Specimens:  None  Findings:  Normal anatomy  Indications:  The patient has been seen and evaluated by Dr. Venetia Maxon. He has recommended disc replacement from an anterior approach at the L5-S1 disc space. I saw and evaluated the patient preoperatively in the clinic and discussed the risks and benefits. She comes in today for her operation.  Procedure:  The patient was identified in the holding area and taken to [facility] NEURO OR 34  The patient was then placed supine on the table. general anesthesia was administered.  The patient was prepped and draped in the usual sterile fashion.  A time out was called and antibiotics were administered.  Fluoroscopy was used to locate the appropriate skin incision level to best expose the L5-S1 disc space. A left lower quadrant incision was made from the midline out to the edge of the rectus muscle. Cautery was used to divide the subcutaneous tissue down to the abdominal wall fascia. The fascia was opened with cautery. Subfascial planes were developed. From the medial side of the rectus, the retroperitoneal space was entered. The retroperitoneum was then swept superiorly and medially. The iliac artery was identified, protected and moved laterally, as was the ureter. Next, the [facility] retractor was placed. Using blunt dissection images of the spine were dissected free. I placed a 150 reverse lip blades on the right side of the spine. A 150 reverse lip blades was placed on the left side of the spine. I then mobilized the peritoneal contents superiorly and held in place with a 140 malleable retractor. The iliac vein was easily  mobilized and protected. Inferiorly, the median sacral vessels were divided with cautery, giving good exposure distally. A malleable retractor was placed to keep the tissue out of the operative field. Fluoroscopy was then used to confirm that the appropriate disc space was exposed. At this point Dr. Venetia Maxon came in and performed his portion of the procedure as well as abdominal closure.   Disposition:  To PACU in stable condition.   Juleen China, M.D. [facility] [place]: [phone] Pager:  [phone]

## 2012-11-11 NOTE — OR Nursing (Signed)
Abdominal x-ray completed upon closure of abdomen. Dr. Fredirick Lathe confirmed no retained objects on x-ray.

## 2012-11-11 NOTE — Progress Notes (Signed)
Awake, alert, conversant.  Minimal pain.  Full strength both lower extremities.  Doing well.

## 2012-11-11 NOTE — Interval H&P Note (Signed)
History and Physical Interval Note:  11/11/2012 7:25 AM  [name] [name]  has presented today for surgery, with the diagnosis of Lumbar stenosis, Scoliosis, Lumbar radiculopathy  The various methods of treatment have been discussed with the patient and family. After consideration of risks, benefits and other options for treatment, the patient has consented to  Procedure(s) with comments: L5-S1 ALIF;L2-3 L3-4 L4-5 Possibly L1-2 XLIF (N/A) - L5-S1 Anterior lumbar interbody fusion with Dr. Myra Gianotti for approach L2-3 L3-4 L4-5 Possibly L1-2 Anterolateral decompression/fusion (N/A) - L2-3 L3-4 L4-5 Possibly L1-2 Anterolateral decompression/fusion ABDOMINAL EXPOSURE (N/A) as a surgical intervention .  The patient's history has been reviewed, patient examined, no change in status, stable for surgery.  I have reviewed the patient's chart and labs.  Questions were answered to the patient's satisfaction.     Niklas Chretien IV, V. WELLS
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
## 2012-11-11 NOTE — Op Note (Signed)
11/11/2012  2:39 PM  PATIENT:  [name] [name]  51 y.o. female  PRE-OPERATIVE DIAGNOSIS:  Lumbar stenosis, Scoliosis, spondylosis, Lumbar radiculopathy, back pain  POST-OPERATIVE DIAGNOSIS:    Lumbar stenosis, Scoliosis, spondylosis, Lumbar radiculopathy, back pain   PROCEDURE:  Procedure(s) with comments: Lumbar five-sacral one Anterior Lumbar Interbody Fusion (N/A) - L5-S1 Anterior lumbar interbody fusion with Dr. Brabham for approach Lumbar two-three, Lumbar three-four, Lumbar four-five Anterolateral decompression and Fusion  (Right) - Lumbar two-three, Lumbar three-four, Lumbar four-five Anterolateral decompression and Fusion  Abdominal/Anterior Exposure (N/A) along with L 12 anterolateral decompression and fusion  SURGEON:  Surgeon(s) and Role: Panel 1:    * Suriah Peragine, MD - Primary    * Robert W Nudelman, MD - Assisting  Panel 2:    * Vance W Brabham, MD - Primary  PHYSICIAN ASSISTANT:   ASSISTANTS: Poteat, RN   ANESTHESIA:   general  EBL:  Total I/O In: 4300 [I.V.:3800; IV Piggyback:500] Out: 925 [Urine:700; Blood:225]  BLOOD ADMINISTERED:none  DRAINS: none   LOCAL MEDICATIONS USED:  LIDOCAINE   SPECIMEN:  No Specimen  DISPOSITION OF SPECIMEN:  N/A  COUNTS:  YES  TOURNIQUET:  * No tourniquets in log *  DICTATION: Patient is a 51-year-old with severe spondylosis stenosis and scoliosis of the lumbar spine. It was elected to take her to surgery for anterolateral decompression and posterior pedicle screw fixation in planned staged basis with ALIF at L 5 S1.  PROCEDURE:  Doctor Brabham performed exposure and his portion of the procedure will be dictated separately.  Upon exposing the L 5 S1 level, a localizing X ray was obtained with the C arm.  I then incised the anterior annulus and performed a thorough discectomy.  The endplates were cleared of disc and cartilagenous material and a thorough discectomy was performed with decompression of the ventral annulus and  disc material.  After trial, a 12 degree lordotic x 12 mm Alphatec PEEK spacer was selected, packed with  BMP and NexOss which was reconstituted with marrow rich blood aspirated from the L 5 vertebral body.  The implant was tamped into position and positioning was confirmed with C arm.  A Lumbosacral plate was then affixed to the L 5 and S1 vertebrae using 25 and 30 x 6.0 mm screws.  Locking mechanisms were engaged, soft tissues were inspected and found to be in good repair.  Retractors were removed.  Fascia was closed with 0 vicryl running stitch, skin edges closed with 2-0 and 3-0 vicryl sutures.  Wound was dressed with a sterile occlusive dressing.  Patient was repositioned for anterolateral decompression and fusion part of procedure. Counts were correct.  Patient wasplaced in a left lateral decubitus position on the operative table and using orthogonally projected C-arm fluoroscopy the patient was placed so that the L1,2 2-3 L3-4 and L4-5 levels were visualized in AP and lateral plane. The patient was then taped into position. The table was flexed so as to expose the L4-5 level. Skin was marked along with a posterior finger dissection incision. Her flank was then prepped and draped in usual sterile fashion and incisions were made sequentially at L4-5 and then all other levels were operated from a single lateral incision.  L3-4, L2-3, and L 12 levels. Posterior finger dissection was made to enter the retroperitoneal space and then subsequently the probe was inserted into the psoas muscle from the right side initially at the L4-5 level. After mapping the neural elements were able to dock the probe per   the midpoint of this vertebral level and without indications electrically of too close proximity to the neural tissues. Subsequently the self-retaining tractor was.after sequential dilators were utilized the shim was employed and the interspace was cleared of psoas muscle and then incised. A thorough discectomy was  performed. Instruments were used to clear the interspace of disc material. After thorough discectomy was performed and this was performed using AP and lateral fluoroscopy an 8 lordotic by 50 x 22 mm implant was packed with BMP and NexOss with autologous blood. This was tamped into position using the slides and its position was confirmed on AP and lateral fluoroscopy. Subsequently exposure was performed at the L3-4 level and similar dissection was performed with locking of the self-retaining retractor. At this level were able to place a 10 lordotic by 22 x 55 mm implant packed in a similar fashion. At the L2-3 level were able to place a 10 mm lordotic by 50 x 22 mm implant packed in a similar fashion. At L 12, an 8 x 22 x 50 lordotic implant was utilized. Hemostasis  was assured the wounds were irrigated and then closed with 1, 2-0 and 3-0 Vicryl stitches. Sterile occlusive dressing was placed with Dermabond. The patient was then extubated in the operating room and taken to recovery in stable and satisfactory condition having tolerated her operation well. Counts were correct at the end of the case.   PLAN OF CARE: Admit to inpatient   PATIENT DISPOSITION:  PACU - hemodynamically stable.   Delay start of Pharmacological VTE agent (>24hrs) due to surgical blood loss or risk of bleeding: yes  

## 2012-11-11 NOTE — Transfer of Care (Signed)
Immediate Anesthesia Transfer of Care Note  Patient: Leane Loring  Procedure(s) Performed: Procedure(s) with comments: Lumbar five-sacral one Anterior Lumbar Interbody Fusion (N/A) - L5-S1 Anterior lumbar interbody fusion with Dr. Myra Gianotti for approach Lumbar two-three, Lumbar three-four, Lumbar four-five Anterolateral decompression and Fusion  (Right) - Lumbar two-three, Lumbar three-four, Lumbar four-five Anterolateral decompression and Fusion  Abdominal/Anterior Exposure (N/A)  Patient Location: PACU  Anesthesia Type:General  Level of Consciousness: awake, alert , oriented and patient cooperative  Airway & Oxygen Therapy: Patient Spontanous Breathing and Patient connected to nasal cannula oxygen  Post-op Assessment: Report given to PACU RN, Post -op Vital signs reviewed and stable and Patient moving all extremities X 4  Post vital signs: Reviewed and stable  Complications: No apparent anesthesia complications

## 2012-11-11 NOTE — Anesthesia Preprocedure Evaluation (Signed)
Anesthesia Evaluation  Patient identified by MRN, date of birth, ID band Patient awake    Reviewed: Allergy & Precautions, H&P , NPO status , Patient's Chart, lab work & pertinent test results  Airway Mallampati: I TM Distance: >3 FB Neck ROM: full    Dental   Pulmonary former smoker,          Cardiovascular Rhythm:regular Rate:Normal     Neuro/Psych  Headaches, PSYCHIATRIC DISORDERS    GI/Hepatic GERD-  ,  Endo/Other    Renal/GU      Musculoskeletal   Abdominal   Peds  Hematology   Anesthesia Other Findings   Reproductive/Obstetrics                           Anesthesia Physical Anesthesia Plan  ASA: II  Anesthesia Plan: General   Post-op Pain Management:    Induction: Intravenous  Airway Management Planned: Oral ETT  Additional Equipment:   Intra-op Plan:   Post-operative Plan: Extubation in OR  Informed Consent: I have reviewed the patients History and Physical, chart, labs and discussed the procedure including the risks, benefits and alternatives for the proposed anesthesia with the patient or authorized representative who has indicated his/her understanding and acceptance.     Plan Discussed with: CRNA, Anesthesiologist and Surgeon  Anesthesia Plan Comments:         Anesthesia Quick Evaluation

## 2012-11-11 NOTE — OR Nursing (Signed)
Abdominal closure began @ 0955.  Abdominal procedure complete @ 1020.

## 2012-11-11 NOTE — Progress Notes (Signed)
Plan is for second stage thoracolumbar fusion with pedicle screw fixation and posterolateral arthrodesis tomorrow.

## 2012-11-11 NOTE — Brief Op Note (Signed)
11/11/2012  2:39 PM  PATIENT:  [name] [name]  52 y.o. female  PRE-OPERATIVE DIAGNOSIS:  Lumbar stenosis, Scoliosis, spondylosis, Lumbar radiculopathy, back pain  POST-OPERATIVE DIAGNOSIS:    Lumbar stenosis, Scoliosis, spondylosis, Lumbar radiculopathy, back pain   PROCEDURE:  Procedure(s) with comments: Lumbar five-sacral one Anterior Lumbar Interbody Fusion (N/A) - L5-S1 Anterior lumbar interbody fusion with Dr. Myra Gianotti for approach Lumbar two-three, Lumbar three-four, Lumbar four-five Anterolateral decompression and Fusion  (Right) - Lumbar two-three, Lumbar three-four, Lumbar four-five Anterolateral decompression and Fusion  Abdominal/Anterior Exposure (N/A) along with L 12 anterolateral decompression and fusion  SURGEON:  Surgeon(s) and Role: Panel 1:    * Maeola Harman, MD - Primary    * Hewitt Shorts, MD - Assisting  Panel 2:    * Nada Libman, MD - Primary  PHYSICIAN ASSISTANT:   ASSISTANTS: Poteat, RN   ANESTHESIA:   general  EBL:  Total I/O In: 4300 [I.V.:3800; IV Piggyback:500] Out: 925 [Urine:700; Blood:225]  BLOOD ADMINISTERED:none  DRAINS: none   LOCAL MEDICATIONS USED:  LIDOCAINE   SPECIMEN:  No Specimen  DISPOSITION OF SPECIMEN:  N/A  COUNTS:  YES  TOURNIQUET:  * No tourniquets in log *  DICTATION: Patient is a 52 year old with severe spondylosis stenosis and scoliosis of the lumbar spine. It was elected to take her to surgery for anterolateral decompression and posterior pedicle screw fixation in planned staged basis with ALIF at L 5 S1.  PROCEDURE:  Doctor Myra Gianotti performed exposure and his portion of the procedure will be dictated separately.  Upon exposing the L 5 S1 level, a localizing X ray was obtained with the C arm.  I then incised the anterior annulus and performed a thorough discectomy.  The endplates were cleared of disc and cartilagenous material and a thorough discectomy was performed with decompression of the ventral annulus and  disc material.  After trial, a 12 degree lordotic x 12 mm Alphatec PEEK spacer was selected, packed with  BMP and NexOss which was reconstituted with marrow rich blood aspirated from the L 5 vertebral body.  The implant was tamped into position and positioning was confirmed with C arm.  A Lumbosacral plate was then affixed to the L 5 and S1 vertebrae using 25 and 30 x 6.0 mm screws.  Locking mechanisms were engaged, soft tissues were inspected and found to be in good repair.  Retractors were removed.  Fascia was closed with 0 vicryl running stitch, skin edges closed with 2-0 and 3-0 vicryl sutures.  Wound was dressed with a sterile occlusive dressing.  Patient was repositioned for anterolateral decompression and fusion part of procedure. Counts were correct.  Patient wasplaced in a left lateral decubitus position on the operative table and using orthogonally projected C-arm fluoroscopy the patient was placed so that the L1,2 2-3 L3-4 and L4-5 levels were visualized in AP and lateral plane. The patient was then taped into position. The table was flexed so as to expose the L4-5 level. Skin was marked along with a posterior finger dissection incision. Her flank was then prepped and draped in usual sterile fashion and incisions were made sequentially at L4-5 and then all other levels were operated from a single lateral incision.  L3-4, L2-3, and L 12 levels. Posterior finger dissection was made to enter the retroperitoneal space and then subsequently the probe was inserted into the psoas muscle from the right side initially at the L4-5 level. After mapping the neural elements were able to dock the probe per  the midpoint of this vertebral level and without indications electrically of too close proximity to the neural tissues. Subsequently the self-retaining tractor was.after sequential dilators were utilized the shim was employed and the interspace was cleared of psoas muscle and then incised. A thorough discectomy was  performed. Instruments were used to clear the interspace of disc material. After thorough discectomy was performed and this was performed using AP and lateral fluoroscopy an 8 lordotic by 50 x 22 mm implant was packed with BMP and NexOss with autologous blood. This was tamped into position using the slides and its position was confirmed on AP and lateral fluoroscopy. Subsequently exposure was performed at the L3-4 level and similar dissection was performed with locking of the self-retaining retractor. At this level were able to place a 10 lordotic by 22 x 55 mm implant packed in a similar fashion. At the L2-3 level were able to place a 10 mm lordotic by 50 x 22 mm implant packed in a similar fashion. At L 12, an 8 x 22 x 50 lordotic implant was utilized. Hemostasis  was assured the wounds were irrigated and then closed with 1, 2-0 and 3-0 Vicryl stitches. Sterile occlusive dressing was placed with Dermabond. The patient was then extubated in the operating room and taken to recovery in stable and satisfactory condition having tolerated her operation well. Counts were correct at the end of the case.   PLAN OF CARE: Admit to inpatient   PATIENT DISPOSITION:  PACU - hemodynamically stable.   Delay start of Pharmacological VTE agent (>24hrs) due to surgical blood loss or risk of bleeding: yes

## 2012-11-12 ENCOUNTER — Inpatient Hospital Stay (HOSPITAL_COMMUNITY): Payer: PRIVATE HEALTH INSURANCE

## 2012-11-12 ENCOUNTER — Inpatient Hospital Stay (HOSPITAL_COMMUNITY): Payer: PRIVATE HEALTH INSURANCE | Admitting: Anesthesiology

## 2012-11-12 ENCOUNTER — Encounter (HOSPITAL_COMMUNITY)
Admission: RE | Disposition: A | Discharge status: Home Health | Payer: Self-pay | Source: Ambulatory Visit | Attending: Neurosurgery

## 2012-11-12 ENCOUNTER — Ambulatory Visit (HOSPITAL_COMMUNITY): Admission: RE | Admit: 2012-11-12 | Payer: PRIVATE HEALTH INSURANCE | Source: Ambulatory Visit | Admitting: Neurosurgery

## 2012-11-12 ENCOUNTER — Encounter (HOSPITAL_COMMUNITY): Payer: Self-pay | Admitting: Anesthesiology

## 2012-11-12 HISTORY — PX: LUMBAR PERCUTANEOUS PEDICLE SCREW 4 LEVEL: SHX6318

## 2012-11-12 LAB — GLUCOSE, CAPILLARY: Glucose-Capillary: 108 mg/dL — ABNORMAL HIGH (ref 70–99)

## 2012-11-12 SURGERY — LUMBAR PERCUTANEOUS PEDICLE SCREW 4 LEVEL
Anesthesia: General | Site: Back | Wound class: Clean

## 2012-11-12 MED ORDER — DEXAMETHASONE SODIUM PHOSPHATE 4 MG/ML IJ SOLN
INTRAMUSCULAR | Status: DC | PRN
  Administered 2012-11-12: 4 mg via INTRAVENOUS

## 2012-11-12 MED ORDER — CEFAZOLIN SODIUM-DEXTROSE 2-3 GM-% IV SOLR
INTRAVENOUS | Status: DC | PRN
  Administered 2012-11-12 (×2): 2 g via INTRAVENOUS

## 2012-11-12 MED ORDER — LIDOCAINE HCL (CARDIAC) 20 MG/ML IV SOLN
INTRAVENOUS | Status: DC | PRN
  Administered 2012-11-12: 80 mg via INTRAVENOUS

## 2012-11-12 MED ORDER — SODIUM CHLORIDE 0.9 % IV SOLN: 250.0000 mL | INTRAVENOUS | Status: DC

## 2012-11-12 MED ORDER — ARTIFICIAL TEARS OP OINT
TOPICAL_OINTMENT | OPHTHALMIC | Status: DC | PRN
  Administered 2012-11-12: 1 via OPHTHALMIC

## 2012-11-12 MED ORDER — OXYCODONE-ACETAMINOPHEN 5-325 MG PO TABS
1.0000 | ORAL_TABLET | ORAL | Status: DC | PRN
  Administered 2012-11-13 – 2012-11-16 (×11): 2 via ORAL
  Filled 2012-11-12 (×7): qty 2

## 2012-11-12 MED ORDER — GLYCOPYRROLATE 0.2 MG/ML IJ SOLN
INTRAMUSCULAR | Status: DC | PRN
  Administered 2012-11-12: 0.4 mg via INTRAVENOUS

## 2012-11-12 MED ORDER — CEFAZOLIN SODIUM 1-5 GM-% IV SOLN
1.0000 g | Freq: Three times a day (TID) | INTRAVENOUS | Status: AC
  Administered 2012-11-12 – 2012-11-13 (×2): 1 g via INTRAVENOUS
  Filled 2012-11-12 (×3): qty 50

## 2012-11-12 MED ORDER — BISACODYL 10 MG RE SUPP: 10.0000 mg | Freq: Every day | RECTAL | Status: DC | PRN

## 2012-11-12 MED ORDER — BUPIVACAINE HCL (PF) 0.5 % IJ SOLN
INTRAMUSCULAR | Status: DC | PRN
  Administered 2012-11-12: 54 mL

## 2012-11-12 MED ORDER — PROPOFOL 10 MG/ML IV BOLUS
INTRAVENOUS | Status: DC | PRN
  Administered 2012-11-12: 200 mg via INTRAVENOUS

## 2012-11-12 MED ORDER — FENTANYL CITRATE 0.05 MG/ML IJ SOLN
50.0000 ug | Freq: Once | INTRAMUSCULAR | Status: DC
  Filled 2012-11-12: qty 2

## 2012-11-12 MED ORDER — SODIUM CHLORIDE 0.9 % IJ SOLN: 3.0000 mL | INTRAMUSCULAR | Status: DC | PRN

## 2012-11-12 MED ORDER — ACETAMINOPHEN 650 MG RE SUPP: 650.0000 mg | RECTAL | Status: DC | PRN

## 2012-11-12 MED ORDER — ONDANSETRON HCL 4 MG/2ML IJ SOLN
INTRAMUSCULAR | Status: DC | PRN
  Administered 2012-11-12: 4 mg via INTRAVENOUS

## 2012-11-12 MED ORDER — ACETAMINOPHEN 325 MG PO TABS
650.0000 mg | ORAL_TABLET | ORAL | Status: DC | PRN
  Administered 2012-11-13 – 2012-11-15 (×2): 650 mg via ORAL
  Filled 2012-11-12 (×2): qty 2

## 2012-11-12 MED ORDER — OXYCODONE HCL 5 MG/5ML PO SOLN: 5.0000 mg | Freq: Once | ORAL | Status: AC | PRN

## 2012-11-12 MED ORDER — PHENYLEPHRINE HCL 10 MG/ML IJ SOLN
INTRAMUSCULAR | Status: DC | PRN
  Administered 2012-11-12 (×5): 80 ug via INTRAVENOUS

## 2012-11-12 MED ORDER — MORPHINE SULFATE 2 MG/ML IJ SOLN
1.0000 mg | INTRAMUSCULAR | Status: DC | PRN
  Administered 2012-11-12: 4 mg via INTRAVENOUS
  Administered 2012-11-13 (×2): 2 mg via INTRAVENOUS
  Administered 2012-11-14: 4 mg via INTRAVENOUS
  Filled 2012-11-12 (×2): qty 1

## 2012-11-12 MED ORDER — NEOSTIGMINE METHYLSULFATE 1 MG/ML IJ SOLN
INTRAMUSCULAR | Status: DC | PRN
  Administered 2012-11-12: 2 mg via INTRAVENOUS

## 2012-11-12 MED ORDER — HYDROCODONE-ACETAMINOPHEN 5-325 MG PO TABS: 1.0000 | ORAL_TABLET | ORAL | Status: DC | PRN

## 2012-11-12 MED ORDER — MENTHOL 3 MG MT LOZG: 1.0000 | LOZENGE | OROMUCOSAL | Status: DC | PRN

## 2012-11-12 MED ORDER — DOCUSATE SODIUM 100 MG PO CAPS: 100.0000 mg | ORAL_CAPSULE | Freq: Two times a day (BID) | ORAL | Status: DC

## 2012-11-12 MED ORDER — VECURONIUM BROMIDE 10 MG IV SOLR
INTRAVENOUS | Status: DC | PRN
  Administered 2012-11-12 (×3): 2 mg via INTRAVENOUS
  Administered 2012-11-12: 1 mg via INTRAVENOUS

## 2012-11-12 MED ORDER — POLYETHYLENE GLYCOL 3350 17 G PO PACK: 17.0000 g | PACK | Freq: Every day | ORAL | Status: DC | PRN

## 2012-11-12 MED ORDER — OXYCODONE HCL 5 MG PO TABS: 5.0000 mg | ORAL_TABLET | Freq: Once | ORAL | Status: AC | PRN

## 2012-11-12 MED ORDER — MIDAZOLAM HCL 5 MG/5ML IJ SOLN
INTRAMUSCULAR | Status: DC | PRN
  Administered 2012-11-12 (×2): 1 mg via INTRAVENOUS

## 2012-11-12 MED ORDER — HYDROMORPHONE HCL PF 1 MG/ML IJ SOLN
0.2500 mg | INTRAMUSCULAR | Status: DC | PRN
  Administered 2012-11-12 – 2012-11-13 (×6): 0.5 mg via INTRAVENOUS
  Filled 2012-11-12 (×2): qty 1

## 2012-11-12 MED ORDER — LIDOCAINE-EPINEPHRINE 1 %-1:100000 IJ SOLN
INTRAMUSCULAR | Status: DC | PRN
  Administered 2012-11-12: 30 mL
  Administered 2012-11-12: 10 mL

## 2012-11-12 MED ORDER — FLEET ENEMA 7-19 GM/118ML RE ENEM: 1.0000 | ENEMA | Freq: Once | RECTAL | Status: AC | PRN

## 2012-11-12 MED ORDER — ALUM & MAG HYDROXIDE-SIMETH 200-200-20 MG/5ML PO SUSP: 30.0000 mL | Freq: Four times a day (QID) | ORAL | Status: DC | PRN

## 2012-11-12 MED ORDER — ONDANSETRON HCL 4 MG/2ML IJ SOLN: 4.0000 mg | INTRAMUSCULAR | Status: DC | PRN

## 2012-11-12 MED ORDER — FENTANYL CITRATE 0.05 MG/ML IJ SOLN
INTRAMUSCULAR | Status: DC | PRN
  Administered 2012-11-12 (×7): 50 ug via INTRAVENOUS

## 2012-11-12 MED ORDER — PHENOL 1.4 % MT LIQD: 1.0000 | OROMUCOSAL | Status: DC | PRN

## 2012-11-12 MED ORDER — LIDOCAINE HCL 4 % MT SOLN
OROMUCOSAL | Status: DC | PRN
  Administered 2012-11-12: 4 mL via TOPICAL

## 2012-11-12 MED ORDER — SODIUM CHLORIDE 0.9 % IJ SOLN
3.0000 mL | Freq: Two times a day (BID) | INTRAMUSCULAR | Status: DC
  Administered 2012-11-12 – 2012-11-14 (×5): 3 mL via INTRAVENOUS

## 2012-11-12 MED ORDER — LACTATED RINGERS IV SOLN
INTRAVENOUS | Status: DC | PRN
  Administered 2012-11-12 (×2): via INTRAVENOUS

## 2012-11-12 MED ORDER — PANTOPRAZOLE SODIUM 40 MG IV SOLR: 40.0000 mg | Freq: Every day | INTRAVENOUS | Status: DC

## 2012-11-12 MED ORDER — MIDAZOLAM HCL 2 MG/2ML IJ SOLN: 1.0000 mg | INTRAMUSCULAR | Status: DC | PRN

## 2012-11-12 MED ORDER — LACTATED RINGERS IV SOLN
INTRAVENOUS | Status: DC | PRN
  Administered 2012-11-12 (×2): via INTRAVENOUS

## 2012-11-12 MED ORDER — SENNA 8.6 MG PO TABS: 1.0000 | ORAL_TABLET | Freq: Two times a day (BID) | ORAL | Status: DC

## 2012-11-12 MED ORDER — 0.9 % SODIUM CHLORIDE (POUR BTL) OPTIME
TOPICAL | Status: DC | PRN
  Administered 2012-11-12: 1000 mL

## 2012-11-12 MED ORDER — PHENYLEPHRINE HCL 10 MG/ML IJ SOLN
10.0000 mg | INTRAVENOUS | Status: DC | PRN
  Administered 2012-11-12: 20 ug/min via INTRAVENOUS

## 2012-11-12 MED ORDER — PROMETHAZINE HCL 25 MG/ML IJ SOLN
6.2500 mg | INTRAMUSCULAR | Status: DC | PRN
  Filled 2012-11-12: qty 1

## 2012-11-12 MED ORDER — KCL IN DEXTROSE-NACL 20-5-0.45 MEQ/L-%-% IV SOLN
INTRAVENOUS | Status: DC
  Administered 2012-11-12 – 2012-11-13 (×2): via INTRAVENOUS
  Filled 2012-11-12 (×10): qty 1000

## 2012-11-12 MED ORDER — ROCURONIUM BROMIDE 100 MG/10ML IV SOLN
INTRAVENOUS | Status: DC | PRN
  Administered 2012-11-12: 50 mg via INTRAVENOUS

## 2012-11-12 MED FILL — Sodium Chloride IV Soln 0.9%: INTRAVENOUS | Qty: 1000 | Status: AC

## 2012-11-12 MED FILL — Heparin Sodium (Porcine) Inj 1000 Unit/ML: INTRAMUSCULAR | Qty: 30 | Status: AC

## 2012-11-12 SURGICAL SUPPLY — 80 items
ADH SKN CLS APL DERMABOND .7 (GAUZE/BANDAGES/DRESSINGS) ×3
ADH SKN CLS LQ APL DERMABOND (GAUZE/BANDAGES/DRESSINGS) ×1
APL SKNCLS STERI-STRIP NONHPOA (GAUZE/BANDAGES/DRESSINGS) ×1
BAG DECANTER FOR FLEXI CONT (MISCELLANEOUS) ×1 IMPLANT
BENZOIN TINCTURE PRP APPL 2/3 (GAUZE/BANDAGES/DRESSINGS) ×2 IMPLANT
BONE VOID FILLER STRIP 10CC (Bone Implant) ×2 IMPLANT
BUR ROUND FLUTED 5 RND (BURR) ×1 IMPLANT
CLOTH BEACON ORANGE TIMEOUT ST (SAFETY) ×2 IMPLANT
CONT SPEC 4OZ CLIKSEAL STRL BL (MISCELLANEOUS) ×4 IMPLANT
COVER BACK TABLE 24X17X13 BIG (DRAPES) ×1 IMPLANT
COVER TABLE BACK 60X90 (DRAPES) ×2 IMPLANT
DERMABOND ADHESIVE PROPEN (GAUZE/BANDAGES/DRESSINGS) ×1
DERMABOND ADVANCED (GAUZE/BANDAGES/DRESSINGS) ×3
DERMABOND ADVANCED .7 DNX12 (GAUZE/BANDAGES/DRESSINGS) IMPLANT
DERMABOND ADVANCED .7 DNX6 (GAUZE/BANDAGES/DRESSINGS) IMPLANT
DRAPE C-ARM 42X72 X-RAY (DRAPES) ×2 IMPLANT
DRAPE C-ARMOR (DRAPES) ×2 IMPLANT
DRAPE LAPAROTOMY 100X72X124 (DRAPES) ×2 IMPLANT
DRAPE POUCH INSTRU U-SHP 10X18 (DRAPES) ×2 IMPLANT
DRAPE SURG 17X23 STRL (DRAPES) ×2 IMPLANT
DRESSING TELFA 8X3 (GAUZE/BANDAGES/DRESSINGS) ×2 IMPLANT
DRSG OPSITE POSTOP 3X4 (GAUZE/BANDAGES/DRESSINGS) ×1 IMPLANT
DRSG OPSITE POSTOP 4X8 (GAUZE/BANDAGES/DRESSINGS) ×3 IMPLANT
DURAPREP 26ML APPLICATOR (WOUND CARE) ×2 IMPLANT
ELECT REM PT RETURN 9FT ADLT (ELECTROSURGICAL) ×2
ELECTRODE REM PT RTRN 9FT ADLT (ELECTROSURGICAL) ×1 IMPLANT
GAUZE SPONGE 4X4 16PLY XRAY LF (GAUZE/BANDAGES/DRESSINGS) ×4 IMPLANT
GLOVE BIO SURGEON STRL SZ 6.5 (GLOVE) ×3 IMPLANT
GLOVE BIO SURGEON STRL SZ8 (GLOVE) ×4 IMPLANT
GLOVE BIOGEL M 8.0 STRL (GLOVE) ×1 IMPLANT
GLOVE BIOGEL PI IND STRL 7.0 (GLOVE) IMPLANT
GLOVE BIOGEL PI IND STRL 8 (GLOVE) ×1 IMPLANT
GLOVE BIOGEL PI IND STRL 8.5 (GLOVE) ×2 IMPLANT
GLOVE BIOGEL PI INDICATOR 7.0 (GLOVE) ×3
GLOVE BIOGEL PI INDICATOR 8 (GLOVE) ×1
GLOVE BIOGEL PI INDICATOR 8.5 (GLOVE) ×2
GLOVE ECLIPSE 8.0 STRL XLNG CF (GLOVE) ×3 IMPLANT
GLOVE EXAM NITRILE LRG STRL (GLOVE) ×2 IMPLANT
GLOVE EXAM NITRILE MD LF STRL (GLOVE) IMPLANT
GLOVE EXAM NITRILE XL STR (GLOVE) IMPLANT
GLOVE EXAM NITRILE XS STR PU (GLOVE) IMPLANT
GOWN BRE IMP SLV AUR LG STRL (GOWN DISPOSABLE) ×2 IMPLANT
GOWN BRE IMP SLV AUR XL STRL (GOWN DISPOSABLE) ×3 IMPLANT
GOWN STRL REIN 2XL LVL4 (GOWN DISPOSABLE) ×2 IMPLANT
KIT BASIN OR (CUSTOM PROCEDURE TRAY) ×2 IMPLANT
KIT INFUSE SMALL (Orthopedic Implant) ×1 IMPLANT
KIT POSITION SURG JACKSON T1 (MISCELLANEOUS) ×2 IMPLANT
KIT ROOM TURNOVER OR (KITS) ×2 IMPLANT
MARKER SKIN DUAL TIP RULER LAB (MISCELLANEOUS) ×2 IMPLANT
NDL HYPO 25X1 1.5 SAFETY (NEEDLE) ×1 IMPLANT
NEEDLE HYPO 25X1 1.5 SAFETY (NEEDLE) ×2 IMPLANT
NEEDLE TROCAR TARGETING (NEEDLE) ×5 IMPLANT
NS IRRIG 1000ML POUR BTL (IV SOLUTION) ×2 IMPLANT
PACK LAMINECTOMY NEURO (CUSTOM PROCEDURE TRAY) ×2 IMPLANT
PAD ARMBOARD 7.5X6 YLW CONV (MISCELLANEOUS) ×6 IMPLANT
PATTIES SURGICAL .5 X.5 (GAUZE/BANDAGES/DRESSINGS) IMPLANT
PATTIES SURGICAL .5 X1 (DISPOSABLE) IMPLANT
PATTIES SURGICAL 1X1 (DISPOSABLE) IMPLANT
ROD TI STRT ILLICO 5.5X270 (Rod) ×2 IMPLANT
SCREW CANN PA ILLICO 6.5X40 (Screw) ×4 IMPLANT
SCREW CANN PA ILLICO 6.5X45 (Screw) ×8 IMPLANT
SCREW SACR CANN ILLICO 6.5X45 (Screw) ×2 IMPLANT
SCREW SET SPINAL STD HEXALOBE (Screw) ×14 IMPLANT
SPONGE GAUZE 4X4 12PLY (GAUZE/BANDAGES/DRESSINGS) ×2 IMPLANT
SPONGE LAP 4X18 X RAY DECT (DISPOSABLE) IMPLANT
STAPLER SKIN PROX WIDE 3.9 (STAPLE) IMPLANT
STRIP CLOSURE SKIN 1/2X4 (GAUZE/BANDAGES/DRESSINGS) ×2 IMPLANT
SUT VIC AB 0 CT1 18XCR BRD8 (SUTURE) IMPLANT
SUT VIC AB 0 CT1 8-18 (SUTURE) ×2
SUT VIC AB 1 CT1 18XBRD ANBCTR (SUTURE) ×2 IMPLANT
SUT VIC AB 1 CT1 8-18 (SUTURE) ×10
SUT VIC AB 2-0 CT1 18 (SUTURE) ×6 IMPLANT
SUT VIC AB 3-0 SH 8-18 (SUTURE) ×7 IMPLANT
SYR 20ML ECCENTRIC (SYRINGE) ×2 IMPLANT
SYR INSULIN 1ML 31GX6 SAFETY (SYRINGE) IMPLANT
TIP TROCAR NITINOL ILLICO 18 (INSTRUMENTS) ×15 IMPLANT
TOWEL OR 17X24 6PK STRL BLUE (TOWEL DISPOSABLE) ×2 IMPLANT
TOWEL OR 17X26 10 PK STRL BLUE (TOWEL DISPOSABLE) ×2 IMPLANT
TRAY FOLEY CATH 14FRSI W/METER (CATHETERS) ×1 IMPLANT
WATER STERILE IRR 1000ML POUR (IV SOLUTION) ×2 IMPLANT

## 2012-11-12 NOTE — Op Note (Signed)
11/11/2012 - 11/12/2012  7:32 PM  PATIENT:  [name] [name]  52 y.o. female  PRE-OPERATIVE DIAGNOSIS:  Stenosis, scoliosis, radiculopathy, spondylosis T 10 - S 1 levels  POST-OPERATIVE DIAGNOSIS: Stenosis, scoliosis, radiculopathy, spondylosis T 10 - S 1 levels  PROCEDURE:  Procedure(s) with comments: Thoracic Ten to Sacral One Percutaneous pedicle screw fixation with bonegraft at the thoracolumbar junction  - T10 to S1 Percutaneous pedicle screw fixation with only bonegraft at the thoracolumbar junction  SURGEON:  Surgeon(s) and Role:    * Maeola Harman, MD - Primary    * Karn Cassis, MD - Assisting  PHYSICIAN ASSISTANT:   ASSISTANTS: Poteat, RN   ANESTHESIA:   general  EBL:  Total I/O In: 500 [I.V.:500] Out: -   BLOOD ADMINISTERED:none  DRAINS: none   LOCAL MEDICATIONS USED:  LIDOCAINE   SPECIMEN:  No Specimen  DISPOSITION OF SPECIMEN:  N/A  COUNTS:  YES  TOURNIQUET:  * No tourniquets in log *  DICTATION: DICTATION: Patient is a 52 year old with severe spondylosis stenosis and scoliosis of the lumbar spine. It was elected to take her to surgery for second stage of planned two stage surgery for anterolateral decompression and posterior pedicle screw fixation.  Procedure: Patient was brought to the operating room and after smooth induction of GETA, patient was then turned into a prone position on the operating table on chest rolls and using AP and lateral fluoroscopy throughout this portion of the procedure, pedicle screws were placed using alphatech cannulated percutaneous screws. 2 screws were placed at T10 and (6.5 x 45 mm) , 2 at T11 of similar size, 2 at T 12 of similar size, one on the left at L 1 ( 65. X 40 mm), 1 on the left at L 2 of similar size, one on the right at L3 of similar size 1 at L4 on the left of similar size, 2 at L5 and 2 at S1( both 6.5 x 45 mm) . 270 mm rods were then affixed to the screw heads do a separate stab incision and locked down on the  screws. All connections were then torqued and the Towers were disassembled. The wounds were irrigated and then closed with 1, 2-0 and 3-0 Vicryl stitches. A midline incision was then made from T 10 to L 1 and the laminae were exposed and decorticated.  Small BMP and NexOss was used to pack in this location.  This incision was then closed in a similar manner. Sterile occlusive dressing was placed with Dermabond and opsite.. The patient was then extubated in the operating room and taken to recovery in stable and satisfactory condition having tolerated his operation well. Counts were correct at the end of the case.   PLAN OF CARE: Admit to inpatient   PATIENT DISPOSITION:  PACU - hemodynamically stable.   Delay start of Pharmacological VTE agent (>24hrs) due to surgical blood loss or risk of bleeding: yes

## 2012-11-12 NOTE — Brief Op Note (Signed)
11/11/2012 - 11/12/2012  7:32 PM  PATIENT:  [name] [name]  51 y.o. female  PRE-OPERATIVE DIAGNOSIS:  Stenosis, scoliosis, radiculopathy, spondylosis T 10 - S 1 levels  POST-OPERATIVE DIAGNOSIS: Stenosis, scoliosis, radiculopathy, spondylosis T 10 - S 1 levels  PROCEDURE:  Procedure(s) with comments: Thoracic Ten to Sacral One Percutaneous pedicle screw fixation with bonegraft at the thoracolumbar junction  - T10 to S1 Percutaneous pedicle screw fixation with only bonegraft at the thoracolumbar junction  SURGEON:  Surgeon(s) and Role:    * Khristina Janota, MD - Primary    * Ernesto M Botero, MD - Assisting  PHYSICIAN ASSISTANT:   ASSISTANTS: Poteat, RN   ANESTHESIA:   general  EBL:  Total I/O In: 500 [I.V.:500] Out: -   BLOOD ADMINISTERED:none  DRAINS: none   LOCAL MEDICATIONS USED:  LIDOCAINE   SPECIMEN:  No Specimen  DISPOSITION OF SPECIMEN:  N/A  COUNTS:  YES  TOURNIQUET:  * No tourniquets in log *  DICTATION: DICTATION: Patient is a 51-year-old with severe spondylosis stenosis and scoliosis of the lumbar spine. It was elected to take her to surgery for second stage of planned two stage surgery for anterolateral decompression and posterior pedicle screw fixation.  Procedure: Patient was brought to the operating room and after smooth induction of GETA, patient was then turned into a prone position on the operating table on chest rolls and using AP and lateral fluoroscopy throughout this portion of the procedure, pedicle screws were placed using alphatech cannulated percutaneous screws. 2 screws were placed at T10 and (6.5 x 45 mm) , 2 at T11 of similar size, 2 at T 12 of similar size, one on the left at L 1 ( 65. X 40 mm), 1 on the left at L 2 of similar size, one on the right at L3 of similar size 1 at L4 on the left of similar size, 2 at L5 and 2 at S1( both 6.5 x 45 mm) . 270 mm rods were then affixed to the screw heads do a separate stab incision and locked down on the  screws. All connections were then torqued and the Towers were disassembled. The wounds were irrigated and then closed with 1, 2-0 and 3-0 Vicryl stitches. A midline incision was then made from T 10 to L 1 and the laminae were exposed and decorticated.  Small BMP and NexOss was used to pack in this location.  This incision was then closed in a similar manner. Sterile occlusive dressing was placed with Dermabond and opsite.. The patient was then extubated in the operating room and taken to recovery in stable and satisfactory condition having tolerated his operation well. Counts were correct at the end of the case.   PLAN OF CARE: Admit to inpatient   PATIENT DISPOSITION:  PACU - hemodynamically stable.   Delay start of Pharmacological VTE agent (>24hrs) due to surgical blood loss or risk of bleeding: yes  

## 2012-11-12 NOTE — Progress Notes (Signed)
Occupational Therapy Discharge Patient Details Name: [name] [name] MRN: [id number] DOB: [birth year] Today's Date: 11/12/2012 Time:  -     Patient discharged from OT services secondary to surgery - will need to re-order OT to resume therapy services.  Please see latest therapy progress note for current level of functioning and progress toward goals.    Progress and discharge plan discussed with patient and/or caregiver: Patient unable to participate in discharge planning and no caregivers available  REORDER OT s/p surgery      Lucile Shutters Pager: [phone]  11/12/2012, 1:40 PM

## 2012-11-12 NOTE — Progress Notes (Signed)
Awake, alert, conversant.  Full strength both lower extremities.  Patient states her back is sore.

## 2012-11-12 NOTE — Anesthesia Postprocedure Evaluation (Signed)
  Anesthesia Post-op Note  Patient: [name] [name]  Procedure(s) Performed: Procedure(s) with comments: Lumbar five-sacral one Anterior Lumbar Interbody Fusion (N/A) - L5-S1 Anterior lumbar interbody fusion with Dr. Myra Gianotti for approach Lumbar two-three, Lumbar three-four, Lumbar four-five Anterolateral decompression and Fusion  (Right) - Lumbar two-three, Lumbar three-four, Lumbar four-five Anterolateral decompression and Fusion  Abdominal/Anterior Exposure (N/A)  Patient Location: PACU  Anesthesia Type:General  Level of Consciousness: awake, alert , oriented and patient cooperative  Airway and Oxygen Therapy: Patient Spontanous Breathing  Post-op Pain: mild  Post-op Assessment: Post-op Vital signs reviewed, Patient's Cardiovascular Status Stable, Respiratory Function Stable, Patent Airway, No signs of Nausea or vomiting and Pain level controlled  Post-op Vital Signs: stable  Complications: No apparent anesthesia complications

## 2012-11-12 NOTE — Progress Notes (Signed)
Pt transferred to 4n31 from PACU . Patient alert and oriented X4. Dressing to back  Honey comb intact with some old dry blood. Will  Continue to monitor closely.

## 2012-11-12 NOTE — Progress Notes (Signed)
I believe this pain is more muscular in nature and likely related to positioning.  She has no numbness or weakness in her left leg and her right leg is without problems.

## 2012-11-12 NOTE — Preoperative (Signed)
Beta Blockers   Reason not to administer Beta Blockers:Not Applicable 

## 2012-11-12 NOTE — Evaluation (Signed)
Physical Therapy Evaluation Patient Details Name: [name] [name] MRN: [id number] DOB: Jan 23, 1961 Today's Date: 11/12/2012 Time: 4098-1191 PT Time Calculation (min): 27 min  PT Assessment / Plan / Recommendation History of Present Illness  Patient admitted for L5-S1 ALIF, L2-3, L3-4, L4-5 decompression and fusion.  Planned second stage fusion today.  Clinical Impression  Patient presents with acute pain, stiffness, decreased knowledge of precautions and of use of DME limiting independence and safety with mobility.  Will benefit from skilled PT in the acute setting to maximize independence and allow return home with family assist and HHPT.    PT Assessment  Patient needs continued PT services    Follow Up Recommendations  Home health PT;Supervision/Assistance - 24 hour (Requests HHPT through [facility])    Does the patient have the potential to tolerate intense rehabilitation    N/A  Barriers to Discharge  None      Equipment Recommendations  Rolling walker with 5" wheels    Recommendations for Other Services   None  Frequency Min 6X/week    Precautions / Restrictions Precautions Precautions: Back Precaution Booklet Issued: No Precaution Comments: Educated on precautions Required Braces or Orthoses: Spinal Brace Spinal Brace: Lumbar corset Restrictions Other Position/Activity Restrictions: No specific brace orders, but pt had corset with her, was applied in sitting   Pertinent Vitals/Pain 7/10 in back      Mobility  Bed Mobility Bed Mobility: Rolling Left;Left Sidelying to Sit;Sitting - Scoot to Edge of Bed Rolling Left: 5: Supervision;With rail Left Sidelying to Sit: 4: Min assist;With rails;HOB elevated Sitting - Scoot to Edge of Bed: 5: Supervision;With rail Details for Bed Mobility Assistance: cues for technique, assist to lift trunk Transfers Transfers: Sit to Stand;Stand to Sit Sit to Stand: 4: Min assist;From bed Stand to Sit: 4: Min assist;To  chair/3-in-1 Details for Transfer Assistance: cues for precautions and hand placement Ambulation/Gait Ambulation/Gait Assistance: 4: Min guard Ambulation Distance (Feet): 20 Feet Assistive device: Rolling walker Ambulation/Gait Assistance Details: in room around bed to chair with increased time required, c/o right toes numb. Gait Pattern: Step-through pattern;Decreased stride length;Trunk flexed        PT Diagnosis: Difficulty walking;Acute pain  PT Problem List: Decreased strength;Decreased activity tolerance;Decreased mobility;Pain;Decreased knowledge of use of DME;Decreased knowledge of precautions PT Treatment Interventions: DME instruction;Gait training;Stair training;Functional mobility training;Patient/family education;Therapeutic activities;Therapeutic exercise     PT Goals(Current goals can be found in the care plan section) Acute Rehab PT Goals Patient Stated Goal: To return home with family assist PT Goal Formulation: With patient Time For Goal Achievement: 11/19/12 Potential to Achieve Goals: Good  Visit Information  Last PT Received On: 11/12/12 Assistance Needed: +1 History of Present Illness: Patient admitted for L5-S1 ALIF, L2-3, L3-4, L4-5 decompression and fusion.  Planned second stage fusion today.       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 Dominant Hand: Right    Cognition  Cognition Arousal/Alertness: Awake/alert Behavior During Therapy: WFL for tasks  assessed/performed Overall Cognitive Status: Within Functional Limits for tasks assessed    Extremity/Trunk Assessment Lower Extremity Assessment Lower Extremity Assessment: Generalized weakness   Balance    End of Session PT -  End of Session Equipment Utilized During Treatment: Back brace Activity Tolerance: Patient limited by pain Patient left: in chair;with call bell/phone within reach  GP     [facility] 11/12/2012, 10:11 AM Sheran Lawless, PT [phone] 11/12/2012

## 2012-11-12 NOTE — Progress Notes (Signed)
Subjective: Patient reports "I'm ok, my leg just hurts."  Objective: Vital signs in last 24 hours: Temp:  [97.2 F (36.2 C)-98.7 F (37.1 C)] 98.3 F (36.8 C) (08/15 0654) Pulse Rate:  [92-117] 97 (08/15 0654) Resp:  [8-18] 17 (08/15 0654) BP: (117-169)/(76-98) 117/76 mmHg (08/15 0654) SpO2:  [92 %-99 %] 97 % (08/15 0654) Arterial Line BP: (137-174)/(76-96) 145/80 mmHg (08/14 1815)  Intake/Output from previous day: 08/14 0701 - 08/15 0700 In: 4300 [I.V.:3800; IV Piggyback:500] Out: 3575 [Urine:3350; Blood:225] Intake/Output this shift:    Alert, conversant. Smiling with conversation, but noting significant left groin to knee pain. Good strength BLE. Incisions right flank and left lower abdomen with Dermabond. No erythema, swelling, or drainage. Belly soft, nontender, BS active x4. No BM overnight.   Lab Results: No results found for this basename: WBC, HGB, HCT, PLT,  in the last 72 hours BMET No results found for this basename: NA, K, CL, CO2, GLUCOSE, BUN, CREATININE, CALCIUM,  in the last 72 hours  Studies/Results: Dg Lumbar Spine Complete  11/11/2012   *RADIOLOGY REPORT*  Clinical Data: Lumbar fusion  DG C-ARM GT 120 MIN,LUMBAR SPINE - COMPLETE 4+ VIEW  Technique: Intraoperative spot imaging  Comparison:  09/20/2012  Findings: Anterior plate and screws are in place at L5 S1. Multiple disc spacers are present within the L1-2 through the L5 S1 disc spaces with marked improvement in the levoscoliosis.  No vertebral compression deformity.  IMPRESSION: L1-S1 fusion.  Improved scoliotic curve.   Original Report Authenticated By: Jolaine Click, M.D.   Dg C-arm Gt 120 Min  11/11/2012   *RADIOLOGY REPORT*  Clinical Data: Lumbar fusion  DG C-ARM GT 120 MIN,LUMBAR SPINE - COMPLETE 4+ VIEW  Technique: Intraoperative spot imaging  Comparison:  09/20/2012  Findings: Anterior plate and screws are in place at L5 S1. Multiple disc spacers are present within the L1-2 through the L5 S1 disc spaces  with marked improvement in the levoscoliosis.  No vertebral compression deformity.  IMPRESSION: L1-S1 fusion.  Improved scoliotic curve.   Original Report Authenticated By: Jolaine Click, M.D.   Dg Or Local Abdomen  11/11/2012   *RADIOLOGY REPORT*  Clinical Data: Anterior lumbar interbody fusion.  Instrument count.  OR LOCAL ABDOMEN  Comparison: None.  Findings: A single portable AP supine view of the abdomen is noted. The right aspect of the abdomen was not included on the image. There are postoperative changes of L5-S1 anterior lumbar interbody fusion.  No unexpected radiopaque foreign body.  Tubal ligation clips are noted.  IMPRESSION: No unexpected radiopaque foreign body.   Original Report Authenticated By: Leanna Battles, M.D.    Assessment/Plan: Doing well 1st day post-op, awaits second stage this afternoon.   LOS: 1 day  Stage two of surgery today: T10-S1 fusion with allograft. Stage one surgery discussed, and planned stage two also discussed, with pt verbalizing understanding.  Appreciate overnight nursing recognizing deleted NPO order & re-implementing.    Georgiann Cocker 11/12/2012, 7:22 AM

## 2012-11-12 NOTE — Transfer of Care (Signed)
Immediate Anesthesia Transfer of Care Note  Patient: [name] [name]  Procedure(s) Performed: Procedure(s) with comments: Thoracic Ten to Sacral One Percutaneous pedicle screw fixation with only bonegraft at the thoracolumbar junction (N/A) - T10 to S1 Percutaneous pedicle screw fixation with only bonegraft at the thoracolumbar junction  Patient Location: PACU  Anesthesia Type:General  Level of Consciousness: awake, alert  and oriented  Airway & Oxygen Therapy: Patient Spontanous Breathing and Patient connected to nasal cannula oxygen  Post-op Assessment: Report given to PACU RN, Post -op Vital signs reviewed and stable and Patient moving all extremities X 4  Post vital signs: Reviewed and stable  Complications: No apparent anesthesia complications

## 2012-11-12 NOTE — Progress Notes (Signed)
   CARE MANAGEMENT NOTE 11/12/2012  Patient:  [name],[name]   Account Number:  [id number]  Date Initiated:  11/12/2012  Documentation initiated by:  Jiles Crocker  Subjective/Objective Assessment:   ADMITTED FOR SURGERY- Anterior Lumbar Interbody Fusion     Action/Plan:   LIVES AT HOME WITH SPOUSE; CM FOLLOWING FOR DCP   Anticipated DC Date:  11/19/2012   Anticipated DC Plan:  POSSIBLY HOME W HOME HEALTH SERVICES, AWAITING FOR PT/OT EVALS FOR DISPOSITION NEEDS.      DC Planning Services  CM consult          Status of service:  In process, will continue to follow Medicare Important Message given?  NA - LOS <3 / Initial given by admissions (If response is "NO", the following Medicare IM given date fields will be blank)  Per UR Regulation:  Reviewed for med. necessity/level of care/duration of stay  Comments:  11/12/2012- B [name] Gorder RN,BSN,MHA

## 2012-11-12 NOTE — Progress Notes (Signed)
Per nursing judgement, pt has been kept NPO since midnight pending 2nd surgery. Nurse notes pt is on surgery schedule for 12:45p today.

## 2012-11-12 NOTE — Anesthesia Procedure Notes (Signed)
Procedure Name: Intubation Date/Time: 11/12/2012 2:45 PM Performed by: Leona Singleton A Pre-anesthesia Checklist: Patient identified, Emergency Drugs available, Suction available and Patient being monitored Patient Re-evaluated:Patient Re-evaluated prior to inductionOxygen Delivery Method: Circle system utilized Preoxygenation: Pre-oxygenation with 100% oxygen Intubation Type: IV induction Ventilation: Mask ventilation without difficulty Laryngoscope Size: Miller and 2 Grade View: Grade I Tube type: Oral Tube size: 7.0 mm Number of attempts: 1 Airway Equipment and Method: Stylet and LTA kit utilized Placement Confirmation: ETT inserted through vocal cords under direct vision,  positive ETCO2 and breath sounds checked- equal and bilateral Secured at: 21 cm Tube secured with: Tape Dental Injury: Teeth and Oropharynx as per pre-operative assessment

## 2012-11-12 NOTE — Anesthesia Preprocedure Evaluation (Addendum)
Anesthesia Evaluation  Patient identified by MRN, date of birth, ID band Patient awake    Reviewed: Allergy & Precautions, H&P , NPO status , Patient's Chart, lab work & pertinent test results  Airway Mallampati: I TM Distance: >3 FB Neck ROM: full    Dental  (+) Teeth Intact and Dental Advisory Given   Pulmonary Pneumonia: remote., former smoker,  breath sounds clear to auscultation        Cardiovascular + Valvular Problems/Murmurs (murmur ) Rhythm:regular Rate:Normal     Neuro/Psych  Headaches, PSYCHIATRIC DISORDERS Anxiety    GI/Hepatic Neg liver ROS, GERD-  Controlled,  Endo/Other  negative endocrine ROS  Renal/GU negative Renal ROS     Musculoskeletal   Abdominal   Peds  Hematology negative hematology ROS (+)   Anesthesia Other Findings   Reproductive/Obstetrics negative OB ROS                          Anesthesia Physical Anesthesia Plan  ASA: II  Anesthesia Plan: General   Post-op Pain Management:    Induction: Intravenous  Airway Management Planned: Oral ETT  Additional Equipment:   Intra-op Plan:   Post-operative Plan: Extubation in OR  Informed Consent: I have reviewed the patients History and Physical, chart, labs and discussed the procedure including the risks, benefits and alternatives for the proposed anesthesia with the patient or authorized representative who has indicated his/her understanding and acceptance.     Plan Discussed with: CRNA and Surgeon  Anesthesia Plan Comments:         Anesthesia Quick Evaluation

## 2012-11-13 MED ORDER — ACETAMINOPHEN 500 MG PO TABS: 500.0000 mg | ORAL_TABLET | ORAL | Status: DC | PRN

## 2012-11-13 MED ORDER — PANTOPRAZOLE SODIUM 40 MG PO TBEC
40.0000 mg | DELAYED_RELEASE_TABLET | Freq: Every day | ORAL | Status: DC
  Administered 2012-11-13 – 2012-11-15 (×3): 40 mg via ORAL
  Filled 2012-11-13 (×3): qty 1

## 2012-11-13 NOTE — Evaluation (Signed)
Occupational Therapy Evaluation Patient Details Name: [name] [name] MRN: [id number] DOB: 15-Aug-1960 Today's Date: 11/13/2012 Time: 1324-4010 OT Time Calculation (min): 24 min  OT Assessment / Plan / Recommendation History of present illness Patient admitted for L5-S1 ALIF, L2-3, L3-4, L4-5 decompression and fusion. Second surgery for t10-s1 fixation and bone graft to the thoracolumbar junction   Clinical Impression   Patient is s/p  ALIF L2-5 with second surgery T10- S1 fixation surgery resulting in functional limitations due to the deficits listed below (see OT problem list).  Patient will benefit from skilled OT acutely to increase independence and safety with ADLS to allow discharge HHOT.     OT Assessment  Patient needs continued OT Services    Follow Up Recommendations  Home health OT    Barriers to Discharge      Equipment Recommendations  3 in 1 bedside comode;Other (comment) (RW)    Recommendations for Other Services    Frequency  Min 2X/week    Precautions / Restrictions Precautions Precautions: Back Precaution Comments: Educated on precautions Required Braces or Orthoses: Spinal Brace Spinal Brace: Lumbar corset   Pertinent Vitals/Pain 8 out 10    ADL  Eating/Feeding: Set up Where Assessed - Eating/Feeding: Chair Grooming: Wash/dry hands;Min guard Where Assessed - Grooming: Supported standing (cues for upright posture and avoid leaning on counter) Upper Body Dressing: Maximal assistance Where Assessed - Upper Body Dressing: Supported sitting (don brace) Lower Body Dressing: +1 Total assistance Where Assessed - Lower Body Dressing: Supported sitting Toilet Transfer: Minimal assistance Statistician Method: Sit to Barista: Raised toilet seat with arms (or 3-in-1 over toilet) Toileting - Clothing Manipulation and Hygiene: Minimal assistance Where Assessed - Glass blower/designer Manipulation and Hygiene: Sit to stand from 3-in-1 or  toilet Equipment Used: Back brace;Rolling walker;Gait belt Transfers/Ambulation Related to ADLs: Pt ambulating with RW and cues for upright posture ADL Comments: pt completed bed mobility, progressed to bathroom, sink level grooming and upright posture in chair for lunch. pt needed cues for back precautions throughout session. Next session to focus on don /d off brace and lb dressing. PT will need continued practice for bed mobility    OT Diagnosis: Generalized weakness;Acute pain  OT Problem List: Decreased strength;Decreased activity tolerance;Impaired balance (sitting and/or standing);Decreased safety awareness;Decreased knowledge of use of DME or AE;Decreased knowledge of precautions;Pain OT Treatment Interventions: Self-care/ADL training;DME and/or AE instruction;Therapeutic activities;Patient/family education;Balance training   OT Goals(Current goals can be found in the care plan section) Acute Rehab OT Goals Patient Stated Goal: To return home with family assist OT Goal Formulation: With patient Time For Goal Achievement: 11/27/12 Potential to Achieve Goals: Good ADL Goals Pt Will Perform Lower Body Bathing: with min assist;sit to/from stand;with adaptive equipment Pt Will Transfer to Toilet: with min guard assist;bedside commode Additional ADL Goal #1: Pt will don doff brace mod I as precursor to basic transfer  Visit Information  Last OT Received On: 11/13/12 Assistance Needed: +1 PT/OT Co-Evaluation/Treatment: Yes History of Present Illness: Patient admitted for L5-S1 ALIF, L2-3, L3-4, L4-5 decompression and fusion. 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 Dominant Hand: Right         Vision/Perception Vision - History Baseline Vision: Wears glasses all the time Patient Visual Report: No change from baseline   Cognition  Cognition Arousal/Alertness: Awake/alert Behavior During Therapy: WFL for tasks assessed/performed Overall Cognitive Status: Within Functional Limits for tasks assessed Memory: Decreased recall of precautions    Extremity/Trunk Assessment Upper Extremity Assessment Upper Extremity Assessment: Overall WFL for tasks assessed Lower Extremity Assessment Lower Extremity Assessment: Defer to PT evaluation     Mobility Bed Mobility Bed Mobility: Rolling Right;Right Sidelying to Sit;Sitting - Scoot to Edge of Bed Rolling Right: 4: Min assist;With rail Right Sidelying to Sit: 3: Mod assist;HOB elevated;With rails (HOB 16 degrees) Sitting - Scoot to Edge of Bed: 4: Min assist Details for Bed Mobility Assistance: Pt requires cues and (A) for bed mobility Transfers Transfers: Sit to Stand;Stand to Sit Sit to Stand: 3: Mod assist;With upper extremity assist;From bed Stand to Sit: 4: Min assist;With upper extremity assist;To chair/3-in-1 Details for Transfer Assistance: pt required more (A) from bed level sit  <>Stand this session due to first time up with therapy since second surgery. Pt with incr independence from elevated 3n1 surface     Exercise     Balance     End of Session OT - End of Session Activity Tolerance: Patient tolerated treatment well Patient left: in chair;with call bell/phone within reach Nurse Communication: Mobility status;Precautions  GO     Lucile Shutters 11/13/2012, 1:05 PM Pager: [phone]

## 2012-11-13 NOTE — Evaluation (Signed)
Physical Therapy Re-Evaluation Patient Details Name: [name] [name] MRN: [id number] DOB: 1960-12-11 Today's Date: 11/13/2012 Time: 4098-1191 PT Time Calculation (min): 24 min  PT Assessment / Plan / Recommendation History of Present Illness  52 y.o. female admitted to [facility] on 11/11/12 for L5-S1 ALIF, L2-3, L3-4, L4-5 decompression and fusion. Second surgery for t10-s1 fixation and bone graft to the thoracolumbar junction completed on 11/12/12.    Clinical Impression  Pt is now POD #1 phase 2 of her surgery and POD #2 phase 1 of her surgery.  She is very sore and limited by pain today, but still mobilizing well with minimal assistance.  D/C plan and goals remain appropriate.  Burning and weakness in left leg today.      PT Assessment  Patient needs continued PT services    Follow Up Recommendations  Home health PT;Supervision/Assistance - 24 hour    Does the patient have the potential to tolerate intense rehabilitation     NA  Barriers to Discharge   None None    Equipment Recommendations  Rolling walker with 5" wheels    Recommendations for Other Services   None  Frequency Min 5X/week    Precautions / Restrictions Precautions Precautions: Back Precaution Comments: Educated on precautions Required Braces or Orthoses: Spinal Brace Spinal Brace: Lumbar corset (no orders for brace or where to donn it.)   Pertinent Vitals/Pain See vitals flow sheet.      Mobility  Bed Mobility Bed Mobility: Rolling Right;Right Sidelying to Sit;Sitting - Scoot to Edge of Bed Rolling Right: 4: Min assist;With rail Right Sidelying to Sit: 3: Mod assist;HOB elevated;With rails (HOB 16 degrees) Sitting - Scoot to Edge of Bed: 4: Min assist Details for Bed Mobility Assistance: pt required cued for hand placement and sequencing to carry out log roll.  Assist to flex bil knee and to help progress hip and trunk to full side lying position.  Pt neede dmod assist to get to sitting with manual facilitation  of hands on handrail.   Transfers Sit to Stand: 3: Mod assist;With upper extremity assist;From bed Stand to Sit: 4: Min assist;With upper extremity assist;To chair/3-in-1 Details for Transfer Assistance: mod assist from lower more compliant bed than from harder, higher 3-in-1.  Verbal cues for safe hand placement during transitions.   Ambulation/Gait Ambulation/Gait Assistance: 4: Min guard Ambulation Distance (Feet): 20 Feet Assistive device: Rolling walker Ambulation/Gait Assistance Details: pt needed assist at trunk at times (light) to maintain balance.  No signs of LE buckling, but stayed in room for mobility today.   Gait Pattern: Step-through pattern;Shuffle;Trunk flexed General Gait Details: verbal cues for upright posture and to look up while she is walking.         PT Diagnosis: Difficulty walking;Abnormality of gait;Generalized weakness;Acute pain  PT Problem List: Decreased strength;Decreased activity tolerance;Decreased mobility;Pain;Decreased knowledge of use of DME;Decreased knowledge of precautions PT Treatment Interventions: DME instruction;Gait training;Stair training;Functional mobility training;Patient/family education;Therapeutic activities;Therapeutic exercise     PT Goals(Current goals can be found in the care plan section) Acute Rehab PT Goals Patient Stated Goal: To return home with family assist PT Goal Formulation: With patient Time For Goal Achievement: 11/20/12 Potential to Achieve Goals: Good  Visit Information  Last PT Received On: 11/13/12 Assistance Needed: +1 PT/OT Co-Evaluation/Treatment: Yes History of Present Illness: 52 y.o. female admitted to [facility] on 11/11/12 for L5-S1 ALIF, L2-3, L3-4, L4-5 decompression and fusion. Second surgery for t10-s1 fixation and bone graft to the thoracolumbar junction completed on 11/12/12.  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 Dominant Hand: Right    Cognition  Cognition Arousal/Alertness: Awake/alert Behavior During Therapy: WFL for tasks assessed/performed Overall Cognitive Status: Within Functional Limits for tasks assessed Memory: Decreased recall of precautions    Extremity/Trunk Assessment Upper Extremity Assessment Upper Extremity Assessment: Defer to OT evaluation Lower Extremity Assessment Lower Extremity Assessment: Generalized weakness (per pt report left leg more burning pain and weaker than R) Cervical / Trunk Assessment Cervical / Trunk Assessment: Normal   Balance Balance Balance Assessed: Yes Static Sitting Balance Static Sitting - Balance Support: Bilateral upper extremity supported;Feet supported Static Sitting - Level of Assistance: 5: Stand by assistance Dynamic Standing Balance Dynamic Standing - Balance Support: Bilateral upper extremity supported Dynamic Standing - Level of Assistance: 4: Min assist Dynamic Standing - Comments: with RW  End of Session PT - End of Session Equipment Utilized During Treatment: Back brace Activity Tolerance: Patient limited by pain Patient left: in chair;with call bell/phone within reach    [place] B. Bulmaro Feagans, PT, DPT [phone]   11/13/2012, 3:47 PM

## 2012-11-13 NOTE — Progress Notes (Signed)
Foley cath discontinued. Patient sat on the side of bed for .

## 2012-11-13 NOTE — Progress Notes (Signed)
C/o incisional lumbar pain. Was oob. Moves both legs well. Analgesics working well. Pt to see

## 2012-11-13 NOTE — Progress Notes (Signed)
Patient ID: [name] [name], female   DOB: 22-Feb-1961, 52 y.o.   MRN: [id number] Postop day #2 from anterior exposure for interbody fusion. Postop day 1 from posterior stabilization. The patient reports that she is very tired. She does have some back discomfort he reports a significant left leg discomfort. Denies any nausea this morning. Had mild nausea last evening  Physical exam: Abdominal incision healing quite nicely with no abdominal tenderness 2+ left popliteal and 2+ left dorsalis pedis pulses  Impression and plan: Stable from anterior exposure. Will not follow actively. No evidence of intra-abdominal or vascular issues. Please call if we can assist

## 2012-11-13 NOTE — Progress Notes (Signed)
Patient assisted to side of the bed. Brace applied sat  Up for approximately 10 min. Tolerated well.

## 2012-11-13 NOTE — Progress Notes (Signed)
PT Cancellation Note  Patient Details Name: [name] [name] MRN: [id number] DOB: 1960/08/06   Cancelled Treatment:    Reason Eval/Treat Not Completed: Other (comment) (pt has already been up this AM will check back).  Pt was up walking to bathroom and in chair for breakfast.  She is now settled back in the bed a resting.  PT to check back later to ambulate/mobilize.     Rollene Rotunda Meily Glowacki, PT, DPT [phone]   11/13/2012, 9:36 AM

## 2012-11-14 ENCOUNTER — Encounter (HOSPITAL_COMMUNITY): Payer: Self-pay

## 2012-11-14 MED ORDER — HEPARIN SODIUM (PORCINE) 5000 UNIT/ML IJ SOLN
5000.0000 [IU] | Freq: Three times a day (TID) | INTRAMUSCULAR | Status: DC
  Administered 2012-11-14 – 2012-11-16 (×7): 5000 [IU] via SUBCUTANEOUS
  Filled 2012-11-14 (×10): qty 1

## 2012-11-14 NOTE — Progress Notes (Signed)
Patient ID: [name] [name], female   DOB: 1961/02/06, 52 y.o.   MRN: [id number] Temp down with respiratory treatment. C/o incisional pain, no weakness in legs. See orders

## 2012-11-14 NOTE — Progress Notes (Signed)
Physical Therapy Treatment Patient Details Name: [name] [name] MRN: [id number] DOB: 08/29/1960 Today's Date: 11/14/2012 Time: 1720-1746 PT Time Calculation (min): 26 min  PT Assessment / Plan / Recommendation  History of Present Illness 52 y.o. female admitted to [facility] on 11/11/12 for L5-S1 ALIF, L2-3, L3-4, L4-5 decompression and fusion. Second surgery for t10-s1 fixation and bone graft to the thoracolumbar junction completed on 11/12/12.     PT Comments   Patient making slow gains with mobility.  Pain remains limiting factor.  Follow Up Recommendations  Home health PT;Supervision/Assistance - 24 hour     Does the patient have the potential to tolerate intense rehabilitation     Barriers to Discharge        Equipment Recommendations  Rolling walker with 5" wheels    Recommendations for Other Services    Frequency Min 5X/week   Progress towards PT Goals Progress towards PT goals: Progressing toward goals  Plan Current plan remains appropriate    Precautions / Restrictions Precautions Precautions: Back Precaution Comments: Patient able to recall 3/3 back precautions. Required Braces or Orthoses: Spinal Brace Spinal Brace: Lumbar corset;Applied in sitting position Restrictions Weight Bearing Restrictions: No   Pertinent Vitals/Pain Pain in LLE limiting mobility.    Mobility  Bed Mobility Bed Mobility: Rolling Left;Left Sidelying to Sit;Sitting - Scoot to Edge of Bed;Sit to Sidelying Left Rolling Left: 5: Supervision;With rail Left Sidelying to Sit: 4: Min assist;HOB flat Sitting - Scoot to Edge of Bed: 4: Min guard Sit to Sidelying Left: 3: Mod assist Details for Bed Mobility Assistance: Verbal cues for log rolling - reminder to bend opposite LE.  Assist to lift trunk off of bed.  Patient required mod assist to don brace.  At end of session, patient required mod assist to raise LE's onto bed to return to sidelying. Transfers Transfers: Sit to Stand;Stand to Sit Sit to  Stand: 4: Min assist;With upper extremity assist;From bed Stand to Sit: 4: Min assist;With upper extremity assist;To bed Details for Transfer Assistance: Verbal cues for hand placement.  Cues to come straight up to standing position.   Ambulation/Gait Ambulation/Gait Assistance: 4: Min guard Ambulation Distance (Feet): 54 Feet Assistive device: Rolling walker Ambulation/Gait Assistance Details: Verbal cues to stay close to RW, stand upright, and look forward during gait. Gait Pattern: Step-through pattern;Shuffle;Trunk flexed Gait velocity: Slow guarded gait      PT Goals (current goals can now be found in the care plan section)    Visit Information  Last PT Received On: 11/14/12 Assistance Needed: +1 History of Present Illness: 52 y.o. female admitted to [facility] on 11/11/12 for L5-S1 ALIF, L2-3, L3-4, L4-5 decompression and fusion. Second surgery for t10-s1 fixation and bone graft to the thoracolumbar junction completed on 11/12/12.      Subjective Data  Subjective: "I can't believe how much my leg hurts"  "I want to get up and move"   Cognition  Cognition Arousal/Alertness: Awake/alert Behavior During Therapy: WFL for tasks assessed/performed Overall Cognitive Status: Within Functional Limits for tasks assessed    Balance     End of Session PT - End of Session Equipment Utilized During Treatment: Gait belt;Back brace Activity Tolerance: Patient limited by pain;Patient limited by fatigue Patient left: in bed;with call bell/phone within reach;with family/visitor present Nurse Communication: Mobility status   GP     Vena Austria 11/14/2012, 5:57 PM Durenda Hurt. Renaldo Fiddler, [facility] Acute Rehab Services Pager [phone]

## 2012-11-15 ENCOUNTER — Encounter (HOSPITAL_COMMUNITY): Payer: Self-pay | Admitting: Neurosurgery

## 2012-11-15 MED ORDER — OXYCODONE HCL 5 MG PO TABS
10.0000 mg | ORAL_TABLET | ORAL | Status: DC | PRN
  Administered 2012-11-15: 10 mg via ORAL
  Filled 2012-11-15: qty 2

## 2012-11-15 MED ORDER — HYDROMORPHONE HCL 2 MG PO TABS: 2.0000 mg | ORAL_TABLET | ORAL | Status: DC | PRN

## 2012-11-15 MED ORDER — GABAPENTIN 100 MG PO CAPS
100.0000 mg | ORAL_CAPSULE | Freq: Three times a day (TID) | ORAL | Status: DC
  Administered 2012-11-15 – 2012-11-16 (×3): 100 mg via ORAL
  Filled 2012-11-15 (×6): qty 1

## 2012-11-15 NOTE — Progress Notes (Signed)
Subjective: Patient reports improving pain, but still sore in left thigh  Objective: Vital signs in last 24 hours: Temp:  [97.9 F (36.6 C)-99.1 F (37.3 C)] 98.7 F (37.1 C) (08/18 0600) Pulse Rate:  [97-107] 97 (08/18 0600) Resp:  [18-20] 18 (08/18 0600) BP: (100-138)/(62-86) 138/86 mmHg (08/18 0600) SpO2:  [92 %-97 %] 94 % (08/18 0600)  Intake/Output from previous day: 08/17 0701 - 08/18 0700 In: -  Out: 2700 [Urine:2700] Intake/Output this shift:    Physical Exam: Mild left Hip flexor weakness.  No numbness.  Tender over left greater trochanter.  Dressings CDI  Lab Results: No results found for this basename: WBC, HGB, HCT, PLT,  in the last 72 hours BMET No results found for this basename: NA, K, CL, CO2, GLUCOSE, BUN, CREATININE, CALCIUM,  in the last 72 hours  Studies/Results: No results found.  Assessment/Plan: Mobilize with PT. Start neurontin.  Bladder rest for urinary retention.  Bowels starting to work.  Will increase pain med strength.    LOS: 4 days    Dorian Heckle, MD 11/15/2012, 7:42 AM

## 2012-11-15 NOTE — Progress Notes (Signed)
OT Cancellation Note  Patient Details Name: [name] [name] MRN: [id number] DOB: 07/15/1960   Cancelled Treatment:    Reason Eval/Treat Not Completed: Pain limiting ability to participate                                            Pt. In bed upon arrival crying secondary to c/o severe back pain.  Offered to reposition pt. States she wants to try to stay in side lying.  R.n. Notified and available meds provided.                                            Will have o.t. Re attempt A/E training when pt. Able to tolerate.   Robet Leu, COTA/L 11/15/2012, 2:14 PM

## 2012-11-15 NOTE — Progress Notes (Signed)
Physical Therapy Treatment Patient Details Name: [name] [name] MRN: [id number] DOB: [birth year] Today's Date: 11/15/2012 Time: 4098-1191 PT Time Calculation (min): 23 min  PT Assessment / Plan / Recommendation  History of Present Illness 52 y.o. female admitted to [facility] on 11/11/12 for L5-S1 ALIF, L2-3, L3-4, L4-5 decompression and fusion. Second surgery for t10-s1 fixation and bone graft to the thoracolumbar junction completed on 11/12/12.     PT Comments   Patient progressing well and able to tolerate stair training this AM. Patient slightly concerned about bed mobility as her caregiver is 50 years old and will not be able to assist her in and out of bed. Will make bed mobility focus of next session.   Follow Up Recommendations  Home health PT;Supervision/Assistance - 24 hour     Does the patient have the potential to tolerate intense rehabilitation     Barriers to Discharge        Equipment Recommendations  Rolling walker with 5" wheels    Recommendations for Other Services    Frequency Min 5X/week   Progress towards PT Goals Progress towards PT goals: Progressing toward goals  Plan Current plan remains appropriate    Precautions / Restrictions Precautions Precautions: Back Precaution Comments: Patient able to recall 3/3 back precautions. Required Braces or Orthoses: Spinal Brace Spinal Brace: Lumbar corset;Applied in sitting position   Pertinent Vitals/Pain 8/10 back pain. patient repositioned for comfort     Mobility  Bed Mobility Sit to Sidelying Left: 3: Mod assist Details for Bed Mobility Assistance: A with LEs back into bed. Cues for log roll technique to ensure precautions, patient maintained well.  Transfers Sit to Stand: With upper extremity assist;4: Min guard;From chair/3-in-1 Stand to Sit: With upper extremity assist;To bed;4: Min guard Details for Transfer Assistance: Patient with correct technique Ambulation/Gait Ambulation/Gait Assistance: 4: Min  guard Ambulation Distance (Feet): 200 Feet Assistive device: Rolling walker Ambulation/Gait Assistance Details: good posture and use of RW Gait Pattern: Step-through pattern;Decreased stride length Stairs: Yes Stairs Assistance: 4: Min guard Stair Management Technique: Step to pattern;Forwards;Two rails Number of Stairs: 5    Exercises     PT Diagnosis:    PT Problem List:   PT Treatment Interventions:     PT Goals (current goals can now be found in the care plan section)    Visit Information  Last PT Received On: 11/15/12 Assistance Needed: +1 History of Present Illness: 52 y.o. female admitted to [facility] on 11/11/12 for L5-S1 ALIF, L2-3, L3-4, L4-5 decompression and fusion. Second surgery for t10-s1 fixation and bone graft to the thoracolumbar junction completed on 11/12/12.      Subjective Data      Cognition  Cognition Arousal/Alertness: Awake/alert Behavior During Therapy: WFL for tasks assessed/performed Overall Cognitive Status: Within Functional Limits for tasks assessed    Balance     End of Session PT - End of Session Equipment Utilized During Treatment: Gait belt;Back brace Activity Tolerance: Patient limited by pain;Patient tolerated treatment well Patient left: in bed;with call bell/phone within reach;with family/visitor present Nurse Communication: Mobility status   GP     Fredrich Birks 11/15/2012, 11:17 AM 11/15/2012 Fredrich Birks PTA [phone] pager [phone] office

## 2012-11-16 MED ORDER — OXYCODONE HCL 10 MG PO TABS: 10.0000 mg | ORAL_TABLET | ORAL | Status: DC | PRN

## 2012-11-16 MED ORDER — GABAPENTIN 100 MG PO CAPS: 100.0000 mg | ORAL_CAPSULE | Freq: Three times a day (TID) | ORAL | Status: DC

## 2012-11-16 MED ORDER — DIAZEPAM 5 MG PO TABS: 5.0000 mg | ORAL_TABLET | Freq: Four times a day (QID) | ORAL | Status: DC | PRN

## 2012-11-16 NOTE — Progress Notes (Signed)
Subjective: Patient reports "I feel much better than last night"  Objective: Vital signs in last 24 hours: Temp:  [98 F (36.7 C)-100.9 F (38.3 C)] 98.5 F (36.9 C) (08/19 0541) Pulse Rate:  [95-110] 99 (08/19 0541) Resp:  [16-18] 18 (08/19 0541) BP: (112-152)/(69-86) 140/85 mmHg (08/19 0541) SpO2:  [96 %-99 %] 96 % (08/19 0541)  Intake/Output from previous day: 08/18 0701 - 08/19 0700 In: -  Out: 6100 [Urine:6100] Intake/Output this shift: Total I/O In: 360 [P.O.:360] Out: -   Alert, conversant. Good strength BLE. Incisions without erythema, swelling, or drainage. Foley indwelling for bladder rest, clear yellow. Pt reporting persistent lumbar and left thigh pain, controlled by Percocet and Valium.  Lab Results: No results found for this basename: WBC, HGB, HCT, PLT,  in the last 72 hours BMET No results found for this basename: NA, K, CL, CO2, GLUCOSE, BUN, CREATININE, CALCIUM,  in the last 72 hours  Studies/Results: No results found.  Assessment/Plan: Improving   LOS: 5 days  Per Dr. Venetia Maxon, d/c foley catheter. Pt may shower. Continue to mobilize in LSO, with hopes of d/c home this afternoon.   Georgiann Cocker 11/16/2012, 7:51 AM

## 2012-11-16 NOTE — Progress Notes (Signed)
Occupational Therapy Treatment Patient Details Name: [name] [name] MRN: [id number] DOB: 1961/03/24 Today's Date: 11/16/2012 Time: 5621-3086 OT Time Calculation (min): 14 min  OT Assessment / Plan / Recommendation  History of present illness 52 y.o. female admitted to [facility] on 11/11/12 for L5-S1 ALIF, L2-3, L3-4, L4-5 decompression and fusion. Second surgery for t10-s1 fixation and bone graft to the thoracolumbar junction completed on 11/12/12.     OT comments  Pt is adequate level for dc home with family (A) for a few days. All OT education complete  Follow Up Recommendations  Home health OT    Barriers to Discharge       Equipment Recommendations  3 in 1 bedside comode;Other (comment)    Recommendations for Other Services    Frequency Min 2X/week   Progress towards OT Goals Progress towards OT goals: Progressing toward goals  Plan Discharge plan remains appropriate    Precautions / Restrictions Precautions Precautions: Back Required Braces or Orthoses: Spinal Brace Spinal Brace: Lumbar corset;Applied in sitting position   Pertinent Vitals/Pain No pain    ADL  Grooming: Wash/dry hands;Modified independent Where Assessed - Grooming: Unsupported standing Lower Body Dressing: Modified independent Where Assessed - Lower Body Dressing: Supported sitting;Other (comment) (AE) Toilet Transfer: Modified independent Toilet Transfer Method: Sit to Barista: Regular height toilet Transfers/Ambulation Related to ADLs: Pt don doff brace with transfer independently ADL Comments: Pt educated on using AE for d/c planning. Pt with excellent return demo and plans to purchase sponge and reacher. pt demonstrated bed mobility. pt is at adequate level for d/c    OT Diagnosis:    OT Problem List:   OT Treatment Interventions:     OT Goals(current goals can now be found in the care plan section) Acute Rehab OT Goals Patient Stated Goal: To return home with family  assist OT Goal Formulation: With patient Time For Goal Achievement: 11/27/12 Potential to Achieve Goals: Good ADL Goals Pt Will Perform Lower Body Bathing: with min assist;sit to/from stand;with adaptive equipment Pt Will Transfer to Toilet: with min guard assist;bedside commode Additional ADL Goal #1: Pt will don doff brace mod I as precursor to basic transfer  Visit Information  Last OT Received On: 11/16/12 Assistance Needed: +1 History of Present Illness: 52 y.o. female admitted to [facility] on 11/11/12 for L5-S1 ALIF, L2-3, L3-4, L4-5 decompression and fusion. Second surgery for t10-s1 fixation and bone graft to the thoracolumbar junction completed on 11/12/12.      Subjective Data      Prior Functioning       Cognition  Cognition Arousal/Alertness: Awake/alert Behavior During Therapy: WFL for tasks assessed/performed Overall Cognitive Status: Within Functional Limits for tasks assessed    Mobility  Bed Mobility Supine to Sit: 6: Modified independent (Device/Increase time) Sit to Supine: 4: Min assist Details for Bed Mobility Assistance: (A) with BIL LE with sit <>Supine. Pt has husband to (A) Transfers Sit to Stand: 5: Supervision;With upper extremity assist;From chair/3-in-1 Stand to Sit: 5: Supervision;With upper extremity assist;To bed    Exercises      Balance     End of Session OT - End of Session Activity Tolerance: Patient tolerated treatment well Patient left: in chair;with call bell/phone within reach Nurse Communication: Mobility status  GO     Harrel Carina [facility] 11/16/2012, 3:11 PM Pager: [phone]

## 2012-11-16 NOTE — Discharge Summary (Signed)
Physician Discharge Summary  Patient ID: [name] [name] MRN: [id number] DOB/AGE: 1960-12-20 52 y.o.  Admit date: 11/11/2012 Discharge date: 11/16/2012  Admission Diagnoses: Stenosis, scoliosis, radiculopathy, spondylosis T 10 - S 1 levels    Discharge Diagnoses: Stenosis, scoliosis, radiculopathy, spondylosis T 10 - S 1 levels s/p Lumbar five-sacral one Anterior Lumbar Interbody Fusion (N/A) - L5-S1 Anterior lumbar interbody fusion with Dr. Myra Gianotti for approach Lumbar two-three, Lumbar three-four, Lumbar four-five Anterolateral decompression and Fusion  (Right) - Lumbar two-three, Lumbar three-four, Lumbar four-five Anterolateral decompression and Fusion and Thoracic Ten to Sacral One Percutaneous pedicle screw fixation with bonegraft at the thoracolumbar junction  - T10 to S1 Percutaneous pedicle screw fixation with only bonegraft at the thoracolumbar junction   Active Problems:   * No active hospital problems. *   Discharged Condition: good  Hospital Course: [name] [name] was admitted for a staged surgery with Dx scoliosis, stenosis, and radiculopathy. First stage 11/11/12 included anterior exposure of L5-S1 disc with Dr. Myra Gianotti, interbody fusion with cage and plate, followed by right side anterolateral decompression and fusion at L2-3, 3-4, 4-5 levels.  Following uncomplicated surgery, she recovered nicely in Neuro PACU and transferred to 4N. Second stage 11/12/12 included posterior percutaneous screws and bilateral rods, with bone graft at thoracolumbar junction. Following uncomplicated surgery, she recovered nicely in Neuro PACU and transferred to 4N.  She progressed steadily, slowed only by significant left thigh pain and urinary retention. She has responded favorably to oral pain meds and a foley for bladder rest.  Consults: vascular surgery  Significant Diagnostic Studies: radiology: X-Ray: intra-operative  Treatments: surgery: Thoracic Ten to Sacral One Percutaneous pedicle screw  fixation with bonegraft at the thoracolumbar junction  - T10 to S1 Percutaneous pedicle screw fixation with only bonegraft at the thoracolumbar junction; Lumbar five-sacral one Anterior Lumbar Interbody Fusion (N/A) - L5-S1 Anterior lumbar interbody fusion with Dr. Myra Gianotti for approach; Lumbar two-three, Lumbar three-four, Lumbar four-five Anterolateral decompression and Fusion  (Right) - Lumbar two-three, Lumbar three-four, Lumbar four-five Anterolateral decompression and Fusion   Discharge Exam: Blood pressure 140/85, pulse 99, temperature 98.5 F (36.9 C), temperature source Oral, resp. rate 18, height 5\' 3"  (1.6 m), weight 76.25 kg (168 lb 1.6 oz), last menstrual period 05/02/2011, SpO2 96.00%. Alert, conversant. Good strength BLE. Incisions without erythema, swelling, or drainage. Foley indwelling for bladder rest, clear yellow. Pt reporting persistent lumbar and left thigh pain, controlled by Percocet and Valium.   Disposition: Discharge to home. HHPT/OT. Rx'es Percocet, Valium, Neurontin. Pt will stop Naproxen & only use Flexeril if Valium stopped. Pt verbalizes understanding of d/c instructions & agrees to call office to schedule 3-4 wk f/u with Dr. Venetia Maxon.     Medication List    ASK your doctor about these medications       cyclobenzaprine 5 MG tablet  Commonly known as:  FLEXERIL  Take 5 mg by mouth 3 (three) times daily as needed for muscle spasms.     FLUoxetine 20 MG capsule  Commonly known as:  PROZAC  Take 20 mg by mouth daily.     HYDROcodone-acetaminophen 5-325 MG per tablet  Commonly known as:  NORCO/VICODIN  Take 1 tablet by mouth every 6 (six) hours as needed for pain.     ketoconazole 2 % cream  Commonly known as:  NIZORAL  Apply 1 application topically as needed.     loratadine 10 MG tablet  Commonly known as:  CLARITIN  Take 10 mg by mouth daily.  LORazepam 1 MG tablet  Commonly known as:  ATIVAN  Take 0.5-1 mg by mouth every 8 (eight) hours as needed  for anxiety.     multivitamin tablet  Take 1 tablet by mouth daily.     naproxen 500 MG tablet  Commonly known as:  NAPROSYN  Take 500 mg by mouth 2 (two) times daily as needed (pain).     omeprazole 20 MG tablet  Commonly known as:  PRILOSEC OTC  Take 20 mg by mouth daily.     oxyCODONE-acetaminophen 5-325 MG per tablet  Commonly known as:  PERCOCET/ROXICET  Take 1 tablet by mouth every 6 (six) hours as needed for pain.         Signed: Georgiann Cocker 11/16/2012, 8:01 AM

## 2012-11-16 NOTE — Anesthesia Postprocedure Evaluation (Signed)
Anesthesia Post Note  Patient: [name] [name]  Procedure(s) Performed: Procedure(s) (LRB): Thoracic Ten to Sacral One Percutaneous pedicle screw fixation with only bonegraft at the thoracolumbar junction (N/A)  Anesthesia type: general  Patient location: PACU  Post pain: Pain level controlled  Post assessment: Patient's Cardiovascular Status Stable  Post vital signs: Reviewed and stable  Level of consciousness: sedated  Complications: No apparent anesthesia complications

## 2012-11-16 NOTE — Care Management Note (Signed)
    Page 1 of 2   11/16/2012     10:15:37 AM   CARE MANAGEMENT NOTE 11/16/2012  Patient:  Erler,Mariya   Account Number:  [id number]  Date Initiated:  11/12/2012  Documentation initiated by:  Jiles Crocker  Subjective/Objective Assessment:   ADMITTED FOR SURGERY- Anterior Lumbar Interbody Fusion     Action/Plan:   LIVES AT HOME WITH SPOUSE; CM FOLLOWING FOR DCP   Anticipated DC Date:  11/19/2012   Anticipated DC Plan:  HOME W HOME HEALTH SERVICES      DC Planning Services  CM consult      Choice offered to / List presented to:  C-1 Patient   DME arranged  3-N-1      DME agency  [facility]     HH arranged  HH-2 PT  HH-3 OT      [facility] agency  [facility]   Status of service:  Completed, signed off Medicare Important Message given?  NA - LOS <3 / Initial given by admissions (If response is "NO", the following Medicare IM given date fields will be blank) Date Medicare IM given:   Date Additional Medicare IM given:    Discharge Disposition:  HOME W HOME HEALTH SERVICES  Per UR Regulation:  Reviewed for med. necessity/level of care/duration of stay  If discussed at Long Length of Stay Meetings, dates discussed:    Comments:  11/16/12 1000 Elmer Bales RN, MSN, CM- met with patient to discuss home health PT/OT.  Pt chose [facility], but they are unable to service the area in which she lives.  Pt's second choice is [facility]. Mary with [facility] was notified and accepted referral.  Notified [facility] DME of 3n1 order and discharge order for today. Pt states that she has a rolling walker at home.  Pt's RN notified.   11/12/2012- B CHANDLER RN,BSN,MHA

## 2012-11-16 NOTE — Progress Notes (Signed)
Still some left hip flexor weakness, but improving.  D/C home when voiding.

## 2012-11-17 ENCOUNTER — Encounter (HOSPITAL_COMMUNITY): Payer: Self-pay | Admitting: Neurosurgery

## 2013-04-18 ENCOUNTER — Ambulatory Visit (INDEPENDENT_AMBULATORY_CARE_PROVIDER_SITE_OTHER): Payer: PRIVATE HEALTH INSURANCE | Admitting: Physician Assistant

## 2013-04-18 ENCOUNTER — Encounter: Payer: Self-pay | Admitting: Physician Assistant

## 2013-04-18 VITALS — BP 112/80 | HR 78 | Temp 98.2°F | Resp 16 | Ht 63.0 in | Wt 175.0 lb

## 2013-04-18 DIAGNOSIS — R21 Rash and other nonspecific skin eruption: Secondary | ICD-10-CM

## 2013-04-18 MED ORDER — CLOTRIMAZOLE-BETAMETHASONE 1-0.05 % EX CREA: 1.0000 "application " | TOPICAL_CREAM | Freq: Two times a day (BID) | CUTANEOUS | Status: DC

## 2013-04-18 NOTE — Progress Notes (Signed)
Subjective:     Patient ID: [name] [name], female   DOB: 1961-01-12, 53 y.o.   MRN: [id number]   CC: rash on shins x 1 week  Rash This is a new problem. The current episode started in the past 7 days. The affected locations include the left lower leg and right lower leg. The rash is characterized by dryness, itchiness, redness and scaling. She was exposed to nothing. Pertinent negatives include no fever. There is no history of eczema.    Rash onset 1+ week. Bumpy red clusters of spots with pruritus. Pt first noticed itchiness on right lower leg at work one day and reports that it spread to the left a day later. Pt did not take any medications for it but tried putting lotion on 1 week ago due to skin dryness. Pt denies changing lotions or new soaps. Denies drainage. Denies outdoor exposures. Pt c/o itchiness during shower and at night. No tingling or prickly feelings. Pt denies rash on upper legs. No h/o rash on shin. H/o athletes foot, reports looking similar. Her husband has h/o athletes foot. Pt treats Athletes foot with Lotrimen AF lotion OTC, still currently using this. Several months ago pt was given Ketoconazole Rx for right foot. Pt denies other skin conditions such as eczema.. No family history of skin conditions. Pt has pets at home.   Review of Systems  Constitutional: Negative for fever and chills.  Musculoskeletal: Negative for arthralgias and myalgias.  Skin: Positive for rash.  Allergic/Immunologic: Negative for environmental allergies.      Objective:   Physical Exam  Constitutional: She is oriented to person, place, and time. Vital signs are normal. She appears well-developed and well-nourished.  HENT:  Head: Normocephalic and atraumatic.  Eyes: Lids are normal. Right eye exhibits no discharge. Left eye exhibits no discharge.  Cardiovascular: Normal rate, regular rhythm and normal heart sounds.  Exam reveals no gallop and no friction rub.   No murmur heard. Pulmonary/Chest:  Effort normal and breath sounds normal.  Lymphadenopathy:    She has no cervical adenopathy.  Neurological: She is alert and oriented to person, place, and time.  Skin: Skin is warm and dry. Rash noted. No bruising and no laceration noted. Rash is not pustular. There is erythema.     Psychiatric: She has a normal mood and affect. Her behavior is normal. Judgment and thought content normal.         Assessment:     Impression:  1. Nonspecific skin eruption, appears fungal.    Plan:      Pharm:  1. Clotrimazole-betamethasone cream BID until 1 week after rash clears. Apply to affected area of lower legs and to feet.   2. Sarna OTC for pruritus.  Non-Pharm: Cool  Compress for pruritus.  Referral: None at this time. Refer to dermatologist if no improvement or worsens in the next couple weeks.  Pt Education: Pt understands how to use anti-fungal cream and was educated about the management options for her skin condition. Follow-up: Return if no improvement 2 weeks or if rash worsens/spreads.   04/18/2013   Martinique Hausladen, PA-S.      I personally interviewed and examined the patient. I have reviewed the student's note and I agree with her findings. Rx clotrimazole betamethasone cream. Also encourage cool compresses and Sarna lotion.

## 2013-04-18 NOTE — Progress Notes (Signed)
Pre visit review using our clinic review tool, if applicable. No additional management support is needed unless otherwise documented below in the visit note/SLS  

## 2013-04-18 NOTE — Patient Instructions (Signed)
Please apply cream twice daily for up to 1 week after rash disappears. Do not take for more than 3 weeks at a time.  Sarna lotion is good for itch.  Cool compresses also help with itch.  Avoid scratching area. Please call or return to clinic if symptoms not improving.

## 2013-04-19 DIAGNOSIS — R21 Rash and other nonspecific skin eruption: Secondary | ICD-10-CM | POA: Insufficient documentation

## 2013-04-19 NOTE — Assessment & Plan Note (Signed)
Appears fungal. Rx clotrimazole-betamethasone cream twice a day until one week after rash disappears. Cool compresses and Sarna lotion for pruritus. Can also use Claritin or Benadryl.

## 2013-05-06 ENCOUNTER — Other Ambulatory Visit: Payer: Self-pay | Admitting: Internal Medicine

## 2013-06-07 ENCOUNTER — Other Ambulatory Visit: Payer: Self-pay | Admitting: Internal Medicine

## 2013-06-20 ENCOUNTER — Ambulatory Visit (INDEPENDENT_AMBULATORY_CARE_PROVIDER_SITE_OTHER): Payer: PRIVATE HEALTH INSURANCE | Admitting: Internal Medicine

## 2013-06-20 ENCOUNTER — Encounter: Payer: Self-pay | Admitting: Internal Medicine

## 2013-06-20 VITALS — BP 116/73 | HR 68 | Temp 98.0°F | Wt 165.5 lb

## 2013-06-20 DIAGNOSIS — K5289 Other specified noninfective gastroenteritis and colitis: Secondary | ICD-10-CM

## 2013-06-20 DIAGNOSIS — K529 Noninfective gastroenteritis and colitis, unspecified: Secondary | ICD-10-CM

## 2013-06-20 MED ORDER — ONDANSETRON HCL 8 MG PO TABS: 8.0000 mg | ORAL_TABLET | Freq: Three times a day (TID) | ORAL | Status: DC | PRN

## 2013-06-20 NOTE — Progress Notes (Signed)
Subjective:    Patient ID: [name] [name], female    DOB: [birth year], 53 y.o.   MRN: [id number]  DOS:  06/20/2013 Type of  visit: Acute visit  Arrived  to [place] 5 days ago and shortly after developed watery diarrhea, mild abdominal discomfort , nausea and vomiting x 1 or 2. The next day , had chills and continue with diarrhea, nausea vomiting were less noticeable. Since then he has diarrhea on and off, crampy abdominal discomfort and a lot of flatus . Has not eaten anything unusual, other family members are not affected. 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 She's taking Imodium.    ROS Denies respiratory symptoms. No fever. She is drinking as much fluids as she can, she's mildly dizzy. Appetite is a slightly decreased. Denies any blood or mucus in the stools. + Mild headaches  Past Medical History  Diagnosis Date  . Anxiety and depression   . Fatty liver     CT 12-2009   fatty change of the liver,  benign appearing 6 cm, cyst lower pole left kidney (incidental findings)  . Varicella   . Arthritis     hips,knees, & back  . Menometrorrhagia 01/05/08  . Perimenopausal 01/05/08  . Goiter 05/2011  . Back pain   . Heart murmur   . Anxiety   . Pneumonia     hx of  . GERD (gastroesophageal reflux disease)   . TIWPYKDX(833.8)     Past Surgical History  Procedure Laterality Date  . Foot surgery  1995    left   . 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 Tonsillectomy  1969  . Adnoids  1969  . Cesarean section  1999  . Tubal ligation  2000    bilateral  . Lumbar percutaneous pedicle screw 4 level N/A 11/12/2012    Procedure: Thoracic Ten to Sacral One Percutaneous pedicle screw fixation with only bonegraft at the thoracolumbar junction;  Surgeon: Erline Levine, MD;  Location: [facility];  Service: Neurosurgery;  Laterality: N/A;  T10 to S1 Percutaneous pedicle screw fixation with only bonegraft at the thoracolumbar junction   . Anterior lumbar fusion N/A 11/11/2012    Procedure: Lumbar five-sacral one Anterior Lumbar Interbody Fusion;  Surgeon: Erline Levine, MD;  Location: [facility];  Service: Neurosurgery;  Laterality: N/A;  L5-S1 Anterior lumbar interbody fusion with Dr. Trula Slade for approach  . Anterior lat lumbar fusion Right 11/11/2012    Procedure: Lumbar two-three, Lumbar three-four, Lumbar four-five Anterolateral decompression and Fusion ;  Surgeon: Erline Levine, MD;  Location: [facility];  Service: Neurosurgery;  Laterality: Right;  Lumbar two-three, Lumbar three-four, Lumbar four-five Anterolateral decompression and Fusion   . Abdominal exposure N/A 11/11/2012    Procedure: Abdominal/Anterior Exposure;  Surgeon: Serafina Mitchell, MD;  Location: [facility];  Service: Vascular;  Laterality: N/A;    History   Social History  . Marital Status: Married    Spouse Name: N/A    Number of Children: 1  . Years of Education: N/A   Occupational History  . 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 Smoking status: Former Smoker    Quit date: 11/09/2006  . Smokeless tobacco: Never Used     Comment: quit 2008, used to snoke 1 ppd  . Alcohol Use: 0.0 oz/week    3-4 Glasses of wine per week  Comment: social   . Drug Use: No  . Sexual Activity: Yes    Partners: Male    Birth Control/ Protection: Surgical     Comment: BTL   Other Topics Concern  . Not on file   Social History Narrative   Married, 1 child                       Medication List       This list is accurate as of: 06/20/13  6:25 PM.  Always use your most recent med list.               clotrimazole-betamethasone cream  Commonly known as:  LOTRISONE  Apply 1 application topically 2 (two) times daily.     FLUoxetine 20 MG capsule  Commonly known as:  PROZAC  Take one capsule daily. Physical DUE please schedule > [phone].     loratadine 10 MG tablet  Commonly known as:  CLARITIN    Take 10 mg by mouth daily as needed.     LORazepam 1 MG tablet  Commonly known as:  ATIVAN  Take 0.5-1 mg by mouth every 8 (eight) hours as needed for anxiety.     multivitamin tablet  Take 1 tablet by mouth daily.     naproxen 500 MG tablet  Commonly known as:  NAPROSYN  Take 500 mg by mouth 2 (two) times daily as needed (pain).     omeprazole 20 MG tablet  Commonly known as:  PRILOSEC OTC  Take 20 mg by mouth daily.     ondansetron 8 MG tablet  Commonly known as:  ZOFRAN  Take 1 tablet (8 mg total) by mouth every 8 (eight) hours as needed for nausea or vomiting.           Objective:   Physical Exam BP 116/73  Pulse 68  Temp(Src) 98 F (36.7 C) (Oral)  Wt 165 lb 8 oz (75.07 kg)  SpO2 97%  LMP 05/02/2011 General -- alert, well-developed, NAD. Non toxic appearing ,, VSS HEENT-- Not pale.  Oral mebranes slt dry Lungs -- normal respiratory effort, no intercostal retractions, no accessory muscle use, and normal breath sounds.  Heart-- normal rate, regular rhythm, no murmur.  Abdomen-- Not distended,  soft, non-tender. Increased BS Extremities-- no pretibial edema bilaterally  Neurologic--  alert & oriented X3. Speech normal, gait normal, strength normal in all extremities.  Psych-- Cognition and judgment appear intact. Cooperative with normal attention span and concentration. No anxious or depressed appearing.        Assessment & Plan:  Acute gastroenteritis, Sx started 5 days ago, so far has experienced no diarrhea today. Likely viral infection. Plan: Conservative treatment, see  instructions

## 2013-06-20 NOTE — Progress Notes (Signed)
Pre visit review using our clinic review tool, if applicable. No additional management support is needed unless otherwise documented below in the visit note. 

## 2013-06-20 NOTE — Patient Instructions (Signed)
Rest, drink plenty of clear fluids, do a bland diet (chicken soup with rice)  Call if you get worse specifically if you have more diarrhea, unable to drink fluids, very dizzy or you see blood in the stools.  Call if not improving in the next few days.  Continue OTC Imodium, take Zofran as needed if nausea      Diarrhea Diarrhea is frequent loose and watery bowel movements. It can cause you to feel weak and dehydrated. Dehydration can cause you to become tired and thirsty, have a dry mouth, and have decreased urination that often is dark yellow. Diarrhea is a sign of another problem, most often an infection that will not last long. In most cases, diarrhea typically lasts 2 3 days. However, it can last longer if it is a sign of something more serious. It is important to treat your diarrhea as directed by your caregive to lessen or prevent future episodes of diarrhea. CAUSES  Some common causes include:  Gastrointestinal infections caused by viruses, bacteria, or parasites.  Food poisoning or food allergies.  Certain medicines, such as antibiotics, chemotherapy, and laxatives.  Artificial sweeteners and fructose.  Digestive disorders. HOME CARE INSTRUCTIONS  Ensure adequate fluid intake (hydration): have 1 cup (8 oz) of fluid for each diarrhea episode. Avoid fluids that contain simple sugars or sports drinks, fruit juices, whole milk products, and sodas. Your urine should be clear or pale yellow if you are drinking enough fluids. Hydrate with an oral rehydration solution that you can purchase at pharmacies, retail stores, and online. You can prepare an oral rehydration solution at home by mixing the following ingredients together:    tsp table salt.   tsp baking soda.   tsp salt substitute containing potassium chloride.  1  tablespoons sugar.  1 L (34 oz) of water.  Certain foods and beverages may increase the speed at which food moves through the gastrointestinal (GI) tract.  These foods and beverages should be avoided and include:  Caffeinated and alcoholic beverages.  High-fiber foods, such as raw fruits and vegetables, nuts, seeds, and whole grain breads and cereals.  Foods and beverages sweetened with sugar alcohols, such as xylitol, sorbitol, and mannitol.  Some foods may be well tolerated and may help thicken stool including:  Starchy foods, such as rice, toast, pasta, low-sugar cereal, oatmeal, grits, baked potatoes, crackers, and bagels.  Bananas.  Applesauce.  Add probiotic-rich foods to help increase healthy bacteria in the GI tract, such as yogurt and fermented milk products.  Wash your hands well after each diarrhea episode.  Only take over-the-counter or prescription medicines as directed by your caregiver.  Take a warm bath to relieve any burning or pain from frequent diarrhea episodes. SEEK IMMEDIATE MEDICAL CARE IF:   You are unable to keep fluids down.  You have persistent vomiting.  You have blood in your stool, or your stools are black and tarry.  You do not urinate in 6 8 hours, or there is only a small amount of very dark urine.  You have abdominal pain that increases or localizes.  You have weakness, dizziness, confusion, or lightheadedness.  You have a severe headache.  Your diarrhea gets worse or does not get better.  You have a fever or persistent symptoms for more than 2 3 days.  You have a fever and your symptoms suddenly get worse. MAKE SURE YOU:   Understand these instructions.  Will watch your condition.  Will get help right away if you are  not doing well or get worse. Document Released: 03/07/2002 Document Revised: 03/03/2012 Document Reviewed: 11/23/2011 [facility] Patient Information 2014 [place], [place].

## 2013-07-10 ENCOUNTER — Other Ambulatory Visit: Payer: Self-pay | Admitting: Internal Medicine

## 2013-09-23 ENCOUNTER — Telehealth: Payer: Self-pay | Admitting: *Deleted

## 2013-09-23 NOTE — Telephone Encounter (Signed)
Left message on voice mail for the patient to return my call regarding upcoming appt.

## 2013-09-26 NOTE — Telephone Encounter (Signed)
Left another message for the patient to return my call.

## 2013-09-26 NOTE — Telephone Encounter (Signed)
Pt called back.  Contact # is [phone]

## 2013-09-26 NOTE — Telephone Encounter (Signed)
Called pt back lmovm to return call.

## 2013-09-27 ENCOUNTER — Encounter: Payer: Self-pay | Admitting: Internal Medicine

## 2013-09-27 ENCOUNTER — Ambulatory Visit (INDEPENDENT_AMBULATORY_CARE_PROVIDER_SITE_OTHER): Payer: PRIVATE HEALTH INSURANCE | Admitting: Internal Medicine

## 2013-09-27 VITALS — BP 117/80 | HR 71 | Temp 98.0°F | Ht 64.8 in | Wt 180.0 lb

## 2013-09-27 DIAGNOSIS — Z0000 Encounter for general adult medical examination without abnormal findings: Secondary | ICD-10-CM

## 2013-09-27 DIAGNOSIS — E010 Iodine-deficiency related diffuse (endemic) goiter: Secondary | ICD-10-CM

## 2013-09-27 DIAGNOSIS — F329 Major depressive disorder, single episode, unspecified: Secondary | ICD-10-CM

## 2013-09-27 DIAGNOSIS — F32A Depression, unspecified: Secondary | ICD-10-CM

## 2013-09-27 DIAGNOSIS — F419 Anxiety disorder, unspecified: Secondary | ICD-10-CM

## 2013-09-27 LAB — CBC WITH DIFFERENTIAL/PLATELET
BASOS ABS: 0 10*3/uL (ref 0.0–0.1)
BASOS PCT: 0.6 % (ref 0.0–3.0)
Eosinophils Absolute: 0.2 10*3/uL (ref 0.0–0.7)
Eosinophils Relative: 4 % (ref 0.0–5.0)
HEMATOCRIT: 39.9 % (ref 36.0–46.0)
HEMOGLOBIN: 13.6 g/dL (ref 12.0–15.0)
LYMPHS ABS: 1.5 10*3/uL (ref 0.7–4.0)
LYMPHS PCT: 27.1 % (ref 12.0–46.0)
MCHC: 34.2 g/dL (ref 30.0–36.0)
MCV: 93.9 fl (ref 78.0–100.0)
MONOS PCT: 9.1 % (ref 3.0–12.0)
Monocytes Absolute: 0.5 10*3/uL (ref 0.1–1.0)
NEUTROS ABS: 3.3 10*3/uL (ref 1.4–7.7)
Neutrophils Relative %: 59.2 % (ref 43.0–77.0)
Platelets: 245 10*3/uL (ref 150.0–400.0)
RBC: 4.25 Mil/uL (ref 3.87–5.11)
RDW: 12.3 % (ref 11.5–15.5)
WBC: 5.6 10*3/uL (ref 4.0–10.5)

## 2013-09-27 LAB — COMPREHENSIVE METABOLIC PANEL
ALT: 22 U/L (ref 0–35)
AST: 26 U/L (ref 0–37)
Albumin: 4.4 g/dL (ref 3.5–5.2)
Alkaline Phosphatase: 88 U/L (ref 39–117)
BILIRUBIN TOTAL: 0.7 mg/dL (ref 0.2–1.2)
BUN: 12 mg/dL (ref 6–23)
CALCIUM: 9.7 mg/dL (ref 8.4–10.5)
CO2: 26 mEq/L (ref 19–32)
CREATININE: 0.6 mg/dL (ref 0.4–1.2)
Chloride: 103 mEq/L (ref 96–112)
GFR: 110.06 mL/min (ref >60.00)
Glucose, Bld: 87 mg/dL (ref 70–99)
Potassium: 3.9 mEq/L (ref 3.5–5.1)
Sodium: 140 mEq/L (ref 135–145)
Total Protein: 7.8 g/dL (ref 6.0–8.3)

## 2013-09-27 LAB — LIPID PANEL
CHOL/HDL RATIO: 4
Cholesterol: 244 mg/dL — ABNORMAL HIGH (ref 0–200)
HDL: 59.9 mg/dL (ref >39.00)
LDL CALC: 157 mg/dL — AB (ref 0–99)
NONHDL: 184.1
TRIGLYCERIDES: 135 mg/dL (ref 0.0–149.0)
VLDL: 27 mg/dL (ref 0.0–40.0)

## 2013-09-27 LAB — TSH: TSH: 1.13 u[IU]/mL (ref 0.35–4.50)

## 2013-09-27 MED ORDER — FLUOXETINE HCL 20 MG PO CAPS: ORAL_CAPSULE | ORAL | Status: DC

## 2013-09-27 MED ORDER — LORAZEPAM 1 MG PO TABS: 0.5000 mg | ORAL_TABLET | Freq: Three times a day (TID) | ORAL | Status: DC | PRN

## 2013-09-27 NOTE — Progress Notes (Signed)
Pre visit review using our clinic review tool, if applicable. No additional management support is needed unless otherwise documented below in the visit note. 

## 2013-09-27 NOTE — Patient Instructions (Signed)
Get your blood work before you leave  You also need a UDS   Next visit is for a physical exam in 1 year fasting Please make an appointment     At some point this year the clinic will relocate to  [place] [address] (10 minutes form here)  [place]  [place], [place] [address] [phone]

## 2013-09-27 NOTE — Assessment & Plan Note (Addendum)
Td 01-2011  Never had a cscope: discussed options, elected cscope, refer to GI Female care per gyn , Dr Leo Grosser, no recent visit or MMG, Strongly encourage to visit gynecology . Labs  Diet exercise discussed Also, had MRI before back surgery was told she had a kidney cyst? Recommend to get the records to see if additional x-rays are needed

## 2013-09-27 NOTE — Assessment & Plan Note (Addendum)
Due for a u/s, see previous entry. Continue with right-sided thyromegaly on exam

## 2013-09-27 NOTE — Progress Notes (Signed)
Subjective:    Patient ID: [name] [name], female    DOB: 06/18/1960, 53 y.o.   MRN: [id number]  DOS:  09/27/2013 Type of  Visit: CPX History: Doing well    ROS Diet, Exercise-- was doing well, lost wt, regaining some wt No  CP, SOB Denies  nausea, vomiting diarrhea, blood in the stools No abdominal pain (-) cough, sputum production (-) wheezing, chest congestion No dysuria, gross hematuria, difficulty urinating    No vaginal discharge, bleed .     Past Medical History  Diagnosis Date  . Anxiety and depression   . Fatty liver     CT 12-2009   fatty change of the liver,  benign appearing 6 cm, cyst lower pole left kidney (incidental findings)  . Varicella   . Arthritis     hips,knees, & back  . Menometrorrhagia 01/05/08  . Perimenopausal 01/05/08  . Goiter 05/2011  . Back pain   . Heart murmur   . Pneumonia     hx of  . GERD (gastroesophageal reflux disease)   . MVHQIONG(295.2)     Past Surgical History  Procedure Laterality Date  . Foot surgery  1995    left   . 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 Tonsillectomy  1969  . Adnoids  1969  . Cesarean section  1999  . Tubal ligation  2000    bilateral  . Lumbar percutaneous pedicle screw 4 level N/A 11/12/2012    Procedure: Thoracic Ten to Sacral One Percutaneous pedicle screw fixation with only bonegraft at the thoracolumbar junction;  Surgeon: Erline Levine, MD;  Location: [facility];  Service: Neurosurgery;  Laterality: N/A;  T10 to S1 Percutaneous pedicle screw fixation with only bonegraft at the thoracolumbar junction  . Anterior lumbar fusion N/A 11/11/2012    Procedure: Lumbar five-sacral one Anterior Lumbar Interbody Fusion;  Surgeon: Erline Levine, MD;  Location: [facility];  Service: Neurosurgery;  Laterality: N/A;  L5-S1 Anterior lumbar interbody fusion with Dr. Trula Slade for approach  . Anterior lat lumbar fusion Right 11/11/2012    Procedure: Lumbar two-three, Lumbar three-four, Lumbar  four-five Anterolateral decompression and Fusion ;  Surgeon: Erline Levine, MD;  Location: [facility];  Service: Neurosurgery;  Laterality: Right;  Lumbar two-three, Lumbar three-four, Lumbar four-five Anterolateral decompression and Fusion   . Abdominal exposure N/A 11/11/2012    Procedure: Abdominal/Anterior Exposure;  Surgeon: Serafina Mitchell, MD;  Location: [facility];  Service: Vascular;  Laterality: N/A;    History   Social History  . Marital Status: Married    Spouse Name: N/A    Number of Children: 1  . Years of Education: N/A   Occupational History  . 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 Smoking status: Former Smoker    Quit date: 11/09/2006  . Smokeless tobacco: Never Used     Comment: quit 2008, used to snoke 1 ppd  . Alcohol Use: 0.0 oz/week    3-4 Glasses of wine per week     Comment: social   . Drug Use: No  . Sexual Activity: Yes    Partners: Male    Birth Control/ Protection: Surgical     Comment: BTL   Other Topics Concern  . Not on file   Social History Narrative   Married, 1 child  Family History  Problem Relation Age of Onset  . Adopted: Yes  . Cancer Mother     lung and thyroid  . Cancer Father     type?  . Breast cancer Neg Hx   . Colon cancer Neg Hx   . Diabetes Neg Hx   . CAD Neg Hx          Medication List       This list is accurate as of: 09/27/13  6:02 PM.  Always use your most recent med list.               FLUoxetine 20 MG capsule  Commonly known as:  PROZAC  Take one capsule daily     loratadine 10 MG tablet  Commonly known as:  CLARITIN  Take 10 mg by mouth daily as needed.     LORazepam 1 MG tablet  Commonly known as:  ATIVAN  Take 0.5-1 tablets (0.5-1 mg total) by mouth every 8 (eight) hours as needed for anxiety.     omeprazole 20 MG tablet  Commonly known as:  PRILOSEC OTC  Take 20 mg by mouth daily.           Objective:   Physical Exam BP  117/80  Pulse 71  Temp(Src) 98 F (36.7 C)  Ht 5' 4.8" (1.646 m)  Wt 180 lb (81.647 kg)  BMI 30.14 kg/m2  SpO2 97%  LMP 05/02/2011  General -- alert, well-developed, NAD.  Neck --+ thyromegaly R sided, no tender or nodular , no LAD HEENT-- Not pale.  Lungs -- normal respiratory effort, no intercostal retractions, no accessory muscle use, and normal breath sounds.  Heart-- normal rate, regular rhythm, no murmur.  Abdomen-- Not distended, good bowel sounds,soft, non-tender.  Extremities-- no pretibial edema bilaterally  Neurologic--  alert & oriented X3. Speech normal, gait appropriate for age, strength symmetric and appropriate for age.  Psych-- Cognition and judgment appear intact. Cooperative with normal attention span and concentration. No anxious or depressed appearing.     Assessment & Plan:

## 2013-09-27 NOTE — Assessment & Plan Note (Signed)
Well-controlled, refill is provided, UDS today

## 2013-09-29 ENCOUNTER — Encounter: Payer: Self-pay | Admitting: *Deleted

## 2013-09-29 NOTE — Progress Notes (Signed)
Letter sent.

## 2013-10-05 ENCOUNTER — Telehealth: Payer: Self-pay | Admitting: *Deleted

## 2013-10-05 NOTE — Telephone Encounter (Signed)
UDS LOW risk 09/28/13

## 2013-10-14 ENCOUNTER — Encounter: Payer: Self-pay | Admitting: Internal Medicine

## 2013-10-25 ENCOUNTER — Encounter: Payer: Self-pay | Admitting: Internal Medicine

## 2013-11-01 ENCOUNTER — Ambulatory Visit
Admission: RE | Admit: 2013-11-01 | Discharge: 2013-11-01 | Disposition: A | Discharge status: Home / Self Care | Payer: PRIVATE HEALTH INSURANCE | Source: Ambulatory Visit | Attending: Internal Medicine | Admitting: Internal Medicine

## 2013-11-01 DIAGNOSIS — E010 Iodine-deficiency related diffuse (endemic) goiter: Secondary | ICD-10-CM

## 2013-11-03 ENCOUNTER — Other Ambulatory Visit: Payer: Self-pay | Admitting: Internal Medicine

## 2013-11-03 DIAGNOSIS — R928 Other abnormal and inconclusive findings on diagnostic imaging of breast: Secondary | ICD-10-CM

## 2013-11-03 DIAGNOSIS — E010 Iodine-deficiency related diffuse (endemic) goiter: Secondary | ICD-10-CM

## 2013-11-09 LAB — HM MAMMOGRAPHY: HM Mammogram: ABNORMAL

## 2013-11-14 ENCOUNTER — Telehealth: Payer: Self-pay | Admitting: Internal Medicine

## 2013-11-14 DIAGNOSIS — R928 Other abnormal and inconclusive findings on diagnostic imaging of breast: Secondary | ICD-10-CM

## 2013-11-14 NOTE — Telephone Encounter (Signed)
Had a mammogram 11/09/2013 at [facility], abnormal, possibly mass at the right breast. They requested a diagnostic mammogram and possibly ultrasound, will enter the orders. Let the patient know we entered the orders, if she is not call within a week let us know

## 2013-11-15 ENCOUNTER — Other Ambulatory Visit: Payer: Self-pay | Admitting: *Deleted

## 2013-11-15 NOTE — Addendum Note (Signed)
Addended by: Peggyann Shoals on: 11/15/2013 10:08 AM   Modules accepted: Orders

## 2013-11-15 NOTE — Telephone Encounter (Signed)
Spoke with Pt and gave her results, Pt will call Jen back and make appt for dx mammogram and ultrasound.

## 2013-11-16 ENCOUNTER — Ambulatory Visit
Admission: RE | Admit: 2013-11-16 | Discharge: 2013-11-16 | Disposition: A | Discharge status: Home / Self Care | Payer: No Typology Code available for payment source | Source: Ambulatory Visit | Attending: Internal Medicine | Admitting: Internal Medicine

## 2013-11-16 ENCOUNTER — Other Ambulatory Visit (HOSPITAL_COMMUNITY)
Admission: RE | Admit: 2013-11-16 | Discharge: 2013-11-16 | Disposition: A | Discharge status: Home / Self Care | Payer: BC Managed Care – PPO | Source: Ambulatory Visit | Attending: Interventional Radiology | Admitting: Interventional Radiology

## 2013-11-16 DIAGNOSIS — E041 Nontoxic single thyroid nodule: Secondary | ICD-10-CM | POA: Insufficient documentation

## 2013-11-16 DIAGNOSIS — Z808 Family history of malignant neoplasm of other organs or systems: Secondary | ICD-10-CM | POA: Insufficient documentation

## 2013-11-16 DIAGNOSIS — E010 Iodine-deficiency related diffuse (endemic) goiter: Secondary | ICD-10-CM

## 2013-11-22 ENCOUNTER — Telehealth: Payer: Self-pay | Admitting: Internal Medicine

## 2013-11-22 NOTE — Telephone Encounter (Signed)
Please ask patient about the diagnostic mammogram and possibly ultrasound,  does she have an appointment already ? Let me know

## 2013-11-23 ENCOUNTER — Other Ambulatory Visit: Payer: Self-pay | Admitting: Internal Medicine

## 2013-11-23 DIAGNOSIS — R928 Other abnormal and inconclusive findings on diagnostic imaging of breast: Secondary | ICD-10-CM

## 2013-11-23 NOTE — Telephone Encounter (Signed)
Spoke to Pt, she has not yet heard from anyone about scheduling her diagnostic mammogram and/or her ultrasound. I have sent Marj a message in hopes of getting an appt set up for this pt.

## 2013-11-28 ENCOUNTER — Encounter: Payer: Self-pay | Admitting: Internal Medicine

## 2013-12-08 ENCOUNTER — Encounter (INDEPENDENT_AMBULATORY_CARE_PROVIDER_SITE_OTHER): Payer: Self-pay

## 2013-12-08 ENCOUNTER — Ambulatory Visit
Admission: RE | Admit: 2013-12-08 | Discharge: 2013-12-08 | Disposition: A | Discharge status: Home / Self Care | Payer: BC Managed Care – PPO | Source: Ambulatory Visit | Attending: Internal Medicine | Admitting: Internal Medicine

## 2013-12-08 DIAGNOSIS — R928 Other abnormal and inconclusive findings on diagnostic imaging of breast: Secondary | ICD-10-CM

## 2013-12-19 ENCOUNTER — Ambulatory Visit (AMBULATORY_SURGERY_CENTER): Payer: Self-pay | Admitting: *Deleted

## 2013-12-19 VITALS — Ht 65.0 in | Wt 184.8 lb

## 2013-12-19 DIAGNOSIS — Z1211 Encounter for screening for malignant neoplasm of colon: Secondary | ICD-10-CM

## 2013-12-19 MED ORDER — MOVIPREP 100 G PO SOLR: 1.0000 | Freq: Once | ORAL | Status: DC

## 2013-12-19 NOTE — Progress Notes (Signed)
No home 02 use. ewm No diet pills. ewm No egg or soy allergy. ewm No problems with past sedation. emw Pt declined emmi video. ewm

## 2014-01-02 ENCOUNTER — Encounter: Payer: Self-pay | Admitting: Internal Medicine

## 2014-01-02 ENCOUNTER — Ambulatory Visit (AMBULATORY_SURGERY_CENTER): Payer: BC Managed Care – PPO | Admitting: Internal Medicine

## 2014-01-02 VITALS — BP 133/93 | HR 63 | Temp 97.4°F | Resp 16 | Ht 65.0 in | Wt 184.0 lb

## 2014-01-02 DIAGNOSIS — Z1211 Encounter for screening for malignant neoplasm of colon: Secondary | ICD-10-CM

## 2014-01-02 DIAGNOSIS — D122 Benign neoplasm of ascending colon: Secondary | ICD-10-CM

## 2014-01-02 MED ORDER — SODIUM CHLORIDE 0.9 % IV SOLN: 500.0000 mL | INTRAVENOUS | Status: DC

## 2014-01-02 NOTE — Op Note (Signed)
[place]  [address]. [place], [address]   COLONOSCOPY PROCEDURE REPORT  PATIENT: [name] [name], [name] [name]  MR#: [id number] BIRTHDATE: [birth year] , 52  yrs. old GENDER: female ENDOSCOPIST: Eustace Quail, MD REFERRED FY:TWKM Larose Kells, M.D. PROCEDURE DATE:  01/02/2014 PROCEDURE:   Colonoscopy with snare polypectomy x2 First Screening Colonoscopy - Avg.  risk and is 50 yrs.  old or older Yes.  Prior Negative Screening - Now for repeat screening. N/A  History of Adenoma - Now for follow-up colonoscopy & has been > or = to 3 yrs.  N/A  Polyps Removed Today? Yes. ASA CLASS:   Class I INDICATIONS:average risk for colorectal cancer. MEDICATIONS: Monitored anesthesia care and Propofol 700 mg IV  DESCRIPTION OF PROCEDURE:   After the risks benefits and alternatives of the procedure were thoroughly explained, informed consent was obtained.  The digital rectal exam revealed no abnormalities of the rectum.   The LB [id number]  endoscope was introduced through the anus and advanced to the cecum, which was identified by both the appendix and ileocecal valve. No adverse events experienced.   The quality of the prep was good, using MoviPrep  The instrument was then slowly withdrawn as the colon was fully examined.      COLON FINDINGS: Two polyps were found in the ascending colon (2 mm and 7 mm sessile).  A polypectomy was performed with a cold snare. The resection was complete, the polyp tissue was completely retrieved and sent to histology.   The examination was otherwise normal.  Retroflexed views revealed internal hemorrhoids. The time to cecum=3 minutes 12 seconds.  Withdrawal time=16 minutes 0 seconds.  The scope was withdrawn and the procedure completed. COMPLICATIONS: There were no immediate complications.  ENDOSCOPIC IMPRESSION: 1.   Two polyps were found in the ascending colon; polypectomy was performed with a cold snare 2.   The examination was otherwise  normal  RECOMMENDATIONS: 1. Follow up colonoscopy in 5 years  eSigned:  Eustace Quail, MD 01/02/2014 11:34 AM   cc: Kathlene November, MD    ; The Patient

## 2014-01-02 NOTE — Patient Instructions (Signed)
YOU HAD AN ENDOSCOPIC PROCEDURE TODAY AT THE [facility]: Refer to the procedure report that was given to you for any specific questions about what was found during the examination.  If the procedure report does not answer your questions, please call your gastroenterologist to clarify.  If you requested that your care partner not be given the details of your procedure findings, then the procedure report has been included in a sealed envelope for you to review at your convenience later.  YOU SHOULD EXPECT: Some feelings of bloating in the abdomen. Passage of more gas than usual.  Walking can help get rid of the air that was put into your GI tract during the procedure and reduce the bloating. If you had a lower endoscopy (such as a colonoscopy or flexible sigmoidoscopy) you may notice spotting of blood in your stool or on the toilet paper. If you underwent a bowel prep for your procedure, then you may not have a normal bowel movement for a few days.  DIET: Your first meal following the procedure should be a light meal and then it is ok to progress to your normal diet.  A half-sandwich or bowl of soup is an example of a good first meal.  Heavy or fried foods are harder to digest and may make you feel nauseous or bloated.  Likewise meals heavy in dairy and vegetables can cause extra gas to form and this can also increase the bloating.  Drink plenty of fluids but you should avoid alcoholic beverages for 24 hours.  ACTIVITY: Your care partner should take you home directly after the procedure.  You should plan to take it easy, moving slowly for the rest of the day.  You can resume normal activity the day after the procedure however you should NOT DRIVE or use heavy machinery for 24 hours (because of the sedation medicines used during the test).    SYMPTOMS TO REPORT IMMEDIATELY: A gastroenterologist can be reached at any hour.  During normal business hours, 8:30 AM to 5:00 PM Monday through Friday,  call [phone].  After hours and on weekends, please call the GI answering service at [phone] who will take a message and have the physician on call contact you.   Following lower endoscopy (colonoscopy or flexible sigmoidoscopy):  Excessive amounts of blood in the stool  Significant tenderness or worsening of abdominal pains  Swelling of the abdomen that is new, acute  Fever of 100F or higher    FOLLOW UP: If any biopsies were taken you will be contacted by phone or by letter within the next 1-3 weeks.  Call your gastroenterologist if you have not heard about the biopsies in 3 weeks.  Our staff will call the home number listed on your records the next business day following your procedure to check on you and address any questions or concerns that you may have at that time regarding the information given to you following your procedure. This is a courtesy call and so if there is no answer at the home number and we have not heard from you through the emergency physician on call, we will assume that you have returned to your regular daily activities without incident.  SIGNATURES/CONFIDENTIALITY: You and/or your care partner have signed paperwork which will be entered into your electronic medical record.  These signatures attest to the fact that that the information above on your After Visit Summary has been reviewed and is understood.  Full responsibility of the confidentiality   this discharge information lies with you and/or your care-partner.   INFORMATION ON POLYPS GIVEN TO YOU TODAY 

## 2014-01-02 NOTE — Progress Notes (Signed)
A/ox3, pleased with MAC, report to RN 

## 2014-01-02 NOTE — Progress Notes (Signed)
Called to room to assist during endoscopic procedure.  Patient ID and intended procedure confirmed with present staff. Received instructions for my participation in the procedure from the performing physician.  

## 2014-01-03 ENCOUNTER — Telehealth: Payer: Self-pay | Admitting: *Deleted

## 2014-01-03 NOTE — Telephone Encounter (Signed)
  Follow up Call-  Call back number 01/02/2014  Post procedure Call Back phone  # [phone]  Permission to leave phone message Yes     Patient questions:  Message left to call us if necessary.

## 2014-01-05 ENCOUNTER — Encounter: Payer: Self-pay | Admitting: Internal Medicine

## 2014-01-30 ENCOUNTER — Encounter: Payer: Self-pay | Admitting: Internal Medicine

## 2014-03-20 ENCOUNTER — Ambulatory Visit (INDEPENDENT_AMBULATORY_CARE_PROVIDER_SITE_OTHER): Payer: BC Managed Care – PPO | Admitting: Internal Medicine

## 2014-03-20 ENCOUNTER — Encounter: Payer: Self-pay | Admitting: Internal Medicine

## 2014-03-20 VITALS — BP 118/66 | HR 94 | Temp 98.5°F | Wt 189.4 lb

## 2014-03-20 DIAGNOSIS — B349 Viral infection, unspecified: Secondary | ICD-10-CM

## 2014-03-20 DIAGNOSIS — R509 Fever, unspecified: Secondary | ICD-10-CM

## 2014-03-20 LAB — POCT INFLUENZA A/B
INFLUENZA B, POC: NEGATIVE
Influenza A, POC: NEGATIVE

## 2014-03-20 MED ORDER — PROMETHAZINE HCL 12.5 MG PO TABS: 12.5000 mg | ORAL_TABLET | ORAL | Status: DC | PRN

## 2014-03-20 NOTE — Patient Instructions (Signed)
Rest, drink plenty of clear fluids Tylenol as needed for headache or fever Phenergan as needed for nausea, will cause drowsiness. Be careful  Call if you are not gradually improving in the next 5 days Call or go to the ER if : severe symptoms, high fever, severe headache, a rash

## 2014-03-20 NOTE — Progress Notes (Signed)
Pre visit review using our clinic review tool, if applicable. No additional management support is needed unless otherwise documented below in the visit note. 

## 2014-03-20 NOTE — Progress Notes (Signed)
Subjective:    Patient ID: [name] [name], female    DOB: 10/10/1960, 53 y.o.   MRN: [id number]  DOS:  03/20/2014 Type of visit - description : ACUTE Interval history: Symptoms started yesterday with subjective fever, chills, feeling cold. She also has generalized myalgias. Yesterday  had ~ 8 episodes of watery diarrhea, small amounts, no blood in the stools. She took Imodium and diarrhea has diminished. She was quite nauseated but no vomiting. She has been able to drink some fluids. Denies any sick contacts or trips outside this area   ROS Denies sinus pain or congestion. Again no vomiting. No cough, sputum production or wheezing. Admits to a headache today. No rash.  Past Medical History  Diagnosis Date  . Anxiety and depression   . Fatty liver     CT 12-2009   fatty change of the liver,  benign appearing 6 cm, cyst lower pole left kidney (incidental findings)  . Varicella   . Arthritis     hips,knees, & back  . Menometrorrhagia 01/05/08  . Perimenopausal 01/05/08  . Goiter 05/2011  . Back pain   . Heart murmur   . Pneumonia     hx of  . GERD (gastroesophageal reflux disease)   . Headache(784.0)   . Blood transfusion without reported diagnosis     Past Surgical History  Procedure Laterality Date  . Foot surgery  1995    left   . 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 Tonsillectomy  1969  . Adnoids  1969  . Cesarean section  1999  . Tubal ligation  2000    bilateral  . Lumbar percutaneous pedicle screw 4 level N/A 11/12/2012    Procedure: Thoracic Ten to Sacral One Percutaneous pedicle screw fixation with only bonegraft at the thoracolumbar junction;  Surgeon: Erline Levine, MD;  Location: [place] [facility];  Service: Neurosurgery;  Laterality: N/A;  T10 to S1 Percutaneous pedicle screw fixation with only bonegraft at the thoracolumbar junction  . Anterior lumbar fusion N/A 11/11/2012    Procedure: Lumbar five-sacral one Anterior Lumbar Interbody Fusion;   Surgeon: Erline Levine, MD;  Location: [place] [facility];  Service: Neurosurgery;  Laterality: N/A;  L5-S1 Anterior lumbar interbody fusion with Dr. Trula Slade for approach  . Anterior lat lumbar fusion Right 11/11/2012    Procedure: Lumbar two-three, Lumbar three-four, Lumbar four-five Anterolateral decompression and Fusion ;  Surgeon: Erline Levine, MD;  Location: [place] [facility];  Service: Neurosurgery;  Laterality: Right;  Lumbar two-three, Lumbar three-four, Lumbar four-five Anterolateral decompression and Fusion   . Abdominal exposure N/A 11/11/2012    Procedure: Abdominal/Anterior Exposure;  Surgeon: Serafina Mitchell, MD;  Location: [facility];  Service: Vascular;  Laterality: N/A;    History   Social History  . Marital Status: Married    Spouse Name: N/A    Number of Children: 1  . Years of Education: N/A   Occupational History  . 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 Smoking status: Former Smoker    Quit date: 11/09/2006  . Smokeless tobacco: Never Used     Comment: quit 2008, used to snoke 1 ppd  . Alcohol Use: 0.0 oz/week    3-4 Glasses of wine per week     Comment: social   . Drug Use: No  . Sexual Activity:    Partners: Male    Patent examiner Protection: Surgical  Comment: BTL   Other Topics Concern  . Not on file   Social History Narrative   Married, 1 child                       Medication List       This list is accurate as of: 03/20/14  7:09 PM.  Always use your most recent med list.               loratadine 10 MG tablet  Commonly known as:  CLARITIN  Take 10 mg by mouth daily as needed.     LORazepam 1 MG tablet  Commonly known as:  ATIVAN  Take 0.5-1 tablets (0.5-1 mg total) by mouth every 8 (eight) hours as needed for anxiety.     omeprazole 20 MG tablet  Commonly known as:  PRILOSEC OTC  Take 20 mg by mouth daily.     promethazine 12.5 MG tablet  Commonly known as:  PHENERGAN  Take 1 tablet (12.5 mg  total) by mouth every 4 (four) hours as needed for nausea or vomiting.           Objective:   Physical Exam BP 118/66 mmHg  Pulse 94  Temp(Src) 98.5 F (36.9 C) (Oral)  Wt 189 lb 6 oz (85.9 kg)  SpO2 96%  LMP 05/02/2011  General -- alert, well-developed, NAD, vital signs stable, not toxic appearing.  Neck-- full range of motion  HEENT-- Not pale.  R Ear-- normal L ear-- normal Throat symmetric, no redness or discharge. Face symmetric, sinuses not tender to palpation. Nose not congested.  Lungs -- normal respiratory effort, no intercostal retractions, no accessory muscle use, and normal breath sounds.  Heart-- normal rate, regular rhythm, no murmur.  Abdomen-- Not distended, good bowel sounds,soft, non-tender. No rebound or rigidity.   Extremities-- no pretibial edema bilaterally  Neurologic--  alert & oriented X3. Speech normal, gait appropriate for age, strength symmetric and appropriate for age.   Psych-- Cognition and judgment appear intact. Cooperative with normal attention span and concentration. No anxious or depressed appearing.       Assessment & Plan:  Viral syndrome Symptoms consistent with a viral syndrome with predominant GI symptoms. She does not look toxic, neck is supple. Flu test negative Plan: Conservative treatment, see instructions

## 2014-06-28 ENCOUNTER — Ambulatory Visit: Payer: Self-pay | Admitting: Internal Medicine

## 2014-07-03 ENCOUNTER — Ambulatory Visit (INDEPENDENT_AMBULATORY_CARE_PROVIDER_SITE_OTHER): Payer: BLUE CROSS/BLUE SHIELD | Admitting: Internal Medicine

## 2014-07-03 ENCOUNTER — Encounter: Payer: Self-pay | Admitting: Internal Medicine

## 2014-07-03 VITALS — BP 130/84 | HR 92 | Temp 98.0°F | Ht 65.0 in | Wt 190.0 lb

## 2014-07-03 DIAGNOSIS — M545 Low back pain, unspecified: Secondary | ICD-10-CM

## 2014-07-03 DIAGNOSIS — L989 Disorder of the skin and subcutaneous tissue, unspecified: Secondary | ICD-10-CM

## 2014-07-03 DIAGNOSIS — F32A Depression, unspecified: Secondary | ICD-10-CM

## 2014-07-03 DIAGNOSIS — F419 Anxiety disorder, unspecified: Secondary | ICD-10-CM

## 2014-07-03 DIAGNOSIS — F329 Major depressive disorder, single episode, unspecified: Secondary | ICD-10-CM

## 2014-07-03 DIAGNOSIS — F418 Other specified anxiety disorders: Secondary | ICD-10-CM | POA: Diagnosis not present

## 2014-07-03 NOTE — Progress Notes (Signed)
Subjective:    Patient ID: [name] [name], female    DOB: 07-12-1960, 54 y.o.   MRN: [id number]  DOS:  07/03/2014 Type of visit - description : acute Interval history: Has a skin lesion on the left arm for a couple of months, occasionally itches, no bleeding. Also right-sided low back pain described as a pressure on and off, sometimes sees at least moderate 5/10 in intensity. No radiation, not changing when she moves her torso.    Review of Systems  Denies nausea, vomiting. No fever chills No dysuria, gross hematuria difficulty urinating  Past Medical History  Diagnosis Date  . Anxiety and depression   . Fatty liver     CT 12-2009   fatty change of the liver,  benign appearing 6 cm, cyst lower pole left kidney (incidental findings)  . Varicella   . Arthritis     hips,knees, & back  . Menometrorrhagia 01/05/08  . Perimenopausal 01/05/08  . Goiter 05/2011  . Back pain   . Heart murmur   . Pneumonia     hx of  . GERD (gastroesophageal reflux disease)   . Headache(784.0)   . Blood transfusion without reported diagnosis     Past Surgical History  Procedure Laterality Date  . Foot surgery  1995    left   . 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 Tonsillectomy  1969  . Adnoids  1969  . Cesarean section  1999  . Tubal ligation  2000    bilateral  . Lumbar percutaneous pedicle screw 4 level N/A 11/12/2012    Procedure: Thoracic Ten to Sacral One Percutaneous pedicle screw fixation with only bonegraft at the thoracolumbar junction;  Surgeon: Erline Levine, MD;  Location: [place] [facility];  Service: Neurosurgery;  Laterality: N/A;  T10 to S1 Percutaneous pedicle screw fixation with only bonegraft at the thoracolumbar junction  . Anterior lumbar fusion N/A 11/11/2012    Procedure: Lumbar five-sacral one Anterior Lumbar Interbody Fusion;  Surgeon: Erline Levine, MD;  Location: [place] [facility];  Service: Neurosurgery;  Laterality: N/A;  L5-S1 Anterior lumbar interbody fusion  with Dr. Trula Slade for approach  . Anterior lat lumbar fusion Right 11/11/2012    Procedure: Lumbar two-three, Lumbar three-four, Lumbar four-five Anterolateral decompression and Fusion ;  Surgeon: Erline Levine, MD;  Location: [place] [facility];  Service: Neurosurgery;  Laterality: Right;  Lumbar two-three, Lumbar three-four, Lumbar four-five Anterolateral decompression and Fusion   . Abdominal exposure N/A 11/11/2012    Procedure: Abdominal/Anterior Exposure;  Surgeon: Serafina Mitchell, MD;  Location: [facility];  Service: Vascular;  Laterality: N/A;    History   Social History  . Marital Status: Married    Spouse Name: N/A  . Number of Children: 1  . Years of Education: N/A   Occupational History  . 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 Smoking status: Former Smoker    Quit date: 11/09/2006  . Smokeless tobacco: Never Used     Comment: quit 2008, used to snoke 1 ppd  . Alcohol Use: 0.0 oz/week    3-4 Glasses of wine per week     Comment: social   . Drug Use: No  . Sexual Activity:    Partners: Male    Birth Control/ Protection: Surgical     Comment: BTL   Other Topics Concern  . Not on file   Social History Narrative   Married,  1 child                       Medication List       This list is accurate as of: 07/03/14 11:59 PM.  Always use your most recent med list.               loratadine 10 MG tablet  Commonly known as:  CLARITIN  Take 10 mg by mouth daily as needed.     omeprazole 20 MG tablet  Commonly known as:  PRILOSEC OTC  Take 20 mg by mouth daily.           Objective:   Physical Exam  Musculoskeletal:       Arms:  BP 130/84 mmHg  Pulse 92  Temp(Src) 98 F (36.7 C) (Oral)  Ht 5\' 5"  (1.651 m)  Wt 190 lb (86.183 kg)  BMI 31.62 kg/m2  SpO2 97%  LMP 05/02/2011  General:   Well developed, well nourished . NAD.  HEENT:  Normocephalic . Face symmetric, atraumatic Abdomen-- no TTP, not tender to  palpation, no CVA tenderness Back--slightly TTP at the sacroiliac area, not tender at the thoracolumbar spine. Neurologic:  alert & oriented X3.  Speech normal, gait appropriate for age and unassisted DTRs symmetric Psych--  Cognition and judgment appear intact.  Cooperative with normal attention span and concentration.  Behavior appropriate. No anxious or depressed appearing.       Assessment & Plan:

## 2014-07-03 NOTE — Progress Notes (Signed)
Pre visit review using our clinic review tool, if applicable. No additional management support is needed unless otherwise documented below in the visit note. 

## 2014-07-03 NOTE — Patient Instructions (Signed)
Go to the lab for a urine test  Come back to the office by June-July 2016  for a physical exam  Please schedule an appointment at the front desk    Come back fasting

## 2014-07-04 DIAGNOSIS — L989 Disorder of the skin and subcutaneous tissue, unspecified: Secondary | ICD-10-CM | POA: Insufficient documentation

## 2014-07-04 LAB — URINALYSIS, ROUTINE W REFLEX MICROSCOPIC
BILIRUBIN URINE: NEGATIVE
Ketones, ur: NEGATIVE
Leukocytes, UA: NEGATIVE
NITRITE: NEGATIVE
Specific Gravity, Urine: <=1.005 — AB (ref 1.000–1.030)
Total Protein, Urine: NEGATIVE
Urine Glucose: NEGATIVE
Urobilinogen, UA: 0.2 (ref 0.0–1.0)
pH: 6 (ref 5.0–8.0)

## 2014-07-04 NOTE — Assessment & Plan Note (Signed)
Back pain, Likely MSK, patient is concerned about a history of kidney cyst. Per chart review she had a CT MRI back in 2011 that had a benign-appearing 6 cm left kidney cyst. Will treat as a MSK problem with the stretching (declined a PT referral), Tylenol and observation. Will check a UA

## 2014-07-04 NOTE — Assessment & Plan Note (Signed)
  Anxiety, No longer needing benzodiazepines.

## 2014-07-04 NOTE — Assessment & Plan Note (Signed)
Skin lesion, refer to dermatology

## 2014-07-05 ENCOUNTER — Encounter: Payer: Self-pay | Admitting: Internal Medicine

## 2014-07-30 DIAGNOSIS — L570 Actinic keratosis: Secondary | ICD-10-CM

## 2014-07-30 DIAGNOSIS — B079 Viral wart, unspecified: Secondary | ICD-10-CM

## 2014-07-30 DIAGNOSIS — L918 Other hypertrophic disorders of the skin: Secondary | ICD-10-CM

## 2014-07-30 HISTORY — DX: Actinic keratosis: L57.0

## 2014-07-30 HISTORY — DX: Viral wart, unspecified: B07.9

## 2014-07-30 HISTORY — DX: Other hypertrophic disorders of the skin: L91.8

## 2014-11-10 LAB — HM MAMMOGRAPHY

## 2014-11-10 LAB — HM PAP SMEAR

## 2014-11-20 ENCOUNTER — Telehealth: Payer: Self-pay | Admitting: Behavioral Health

## 2014-11-20 NOTE — Telephone Encounter (Signed)
Patient rescheduled for 01/10/15 at 1:30 PM.

## 2014-11-21 ENCOUNTER — Encounter: Payer: BLUE CROSS/BLUE SHIELD | Admitting: Internal Medicine

## 2014-12-04 IMAGING — CR DG CHEST 2V
2 series · 2 of 2 positions shown · non-contrast
Comparison: 09/27/2012

CLINICAL DATA: Preop for spine surgery

CHEST - 2 VIEW

[w chest pa]
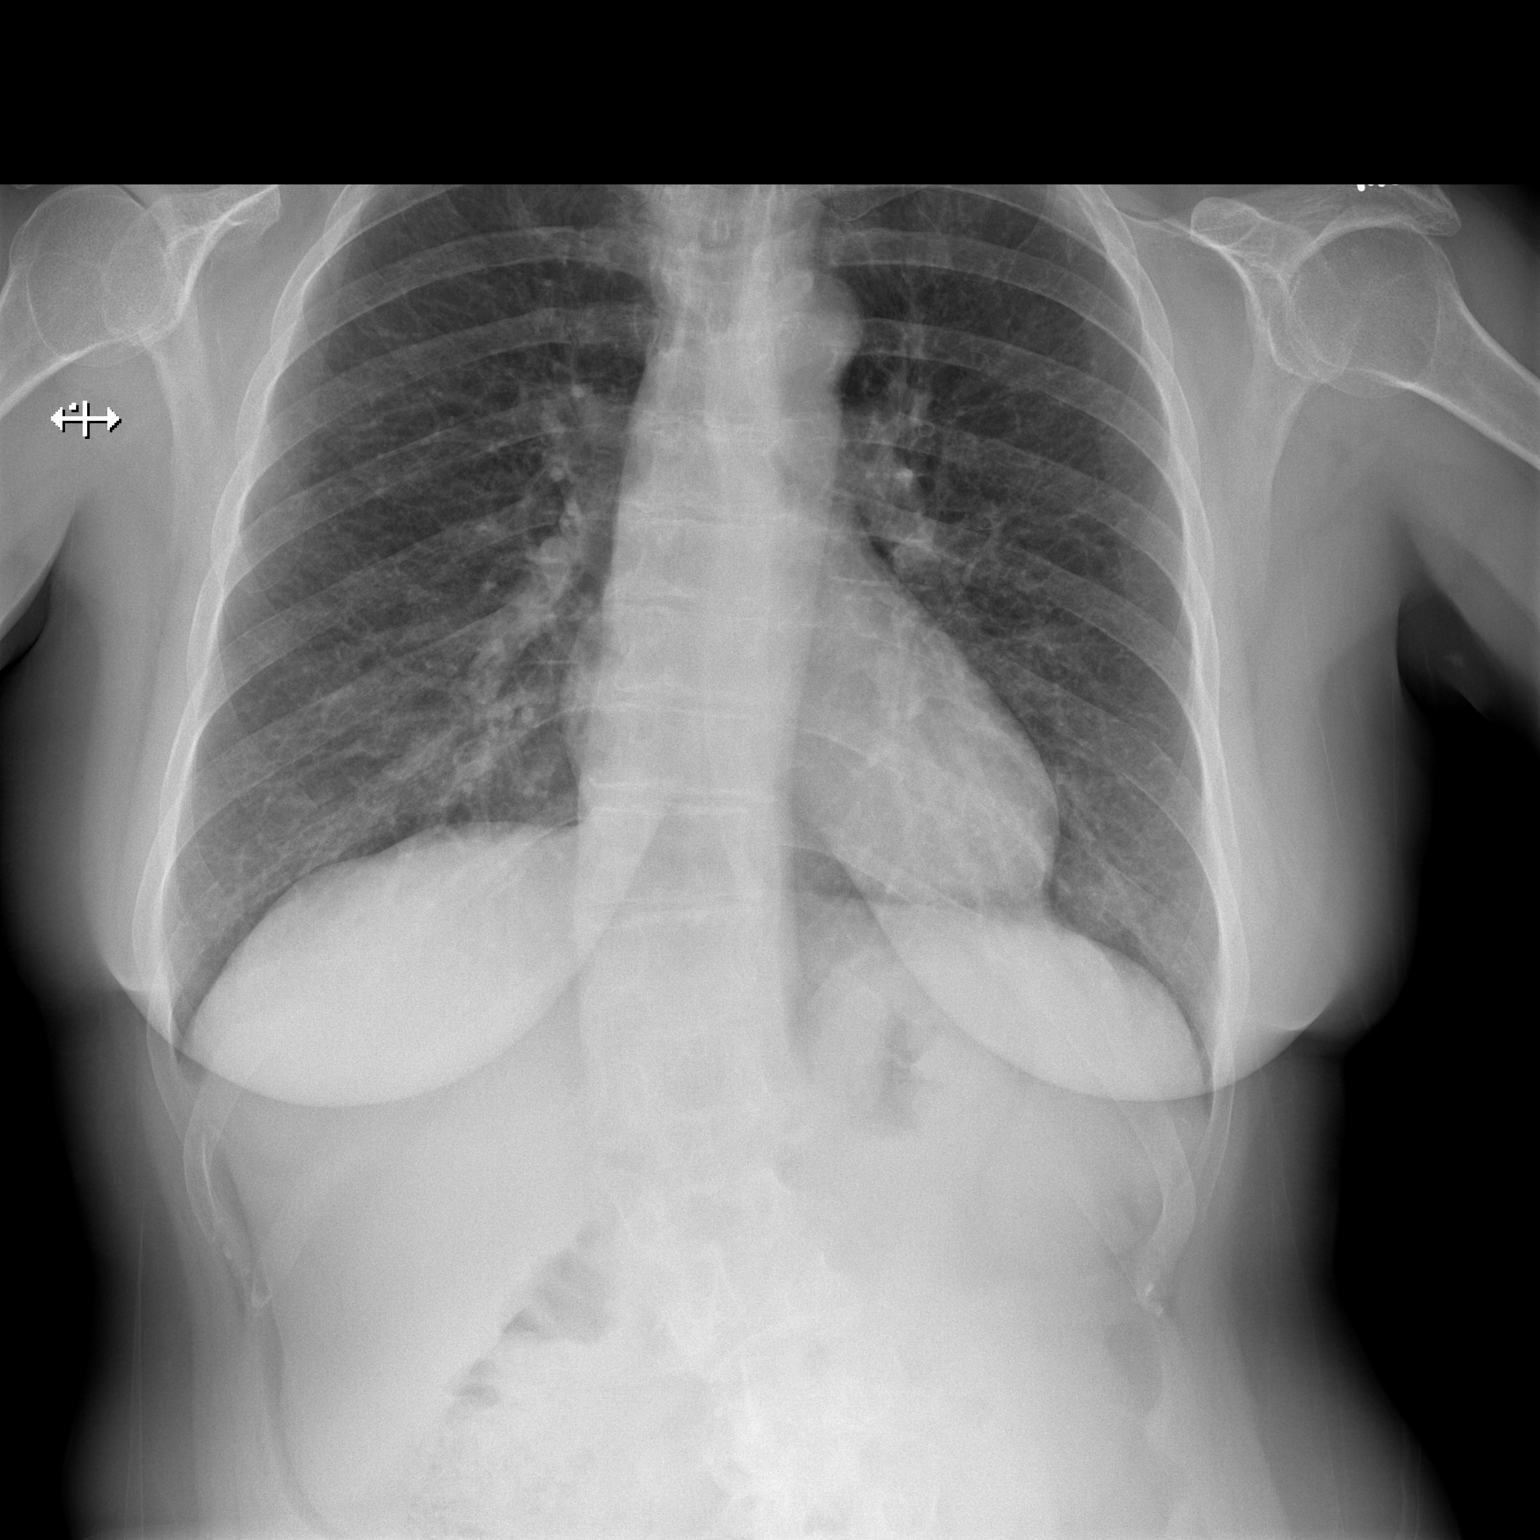

[w chest lat]
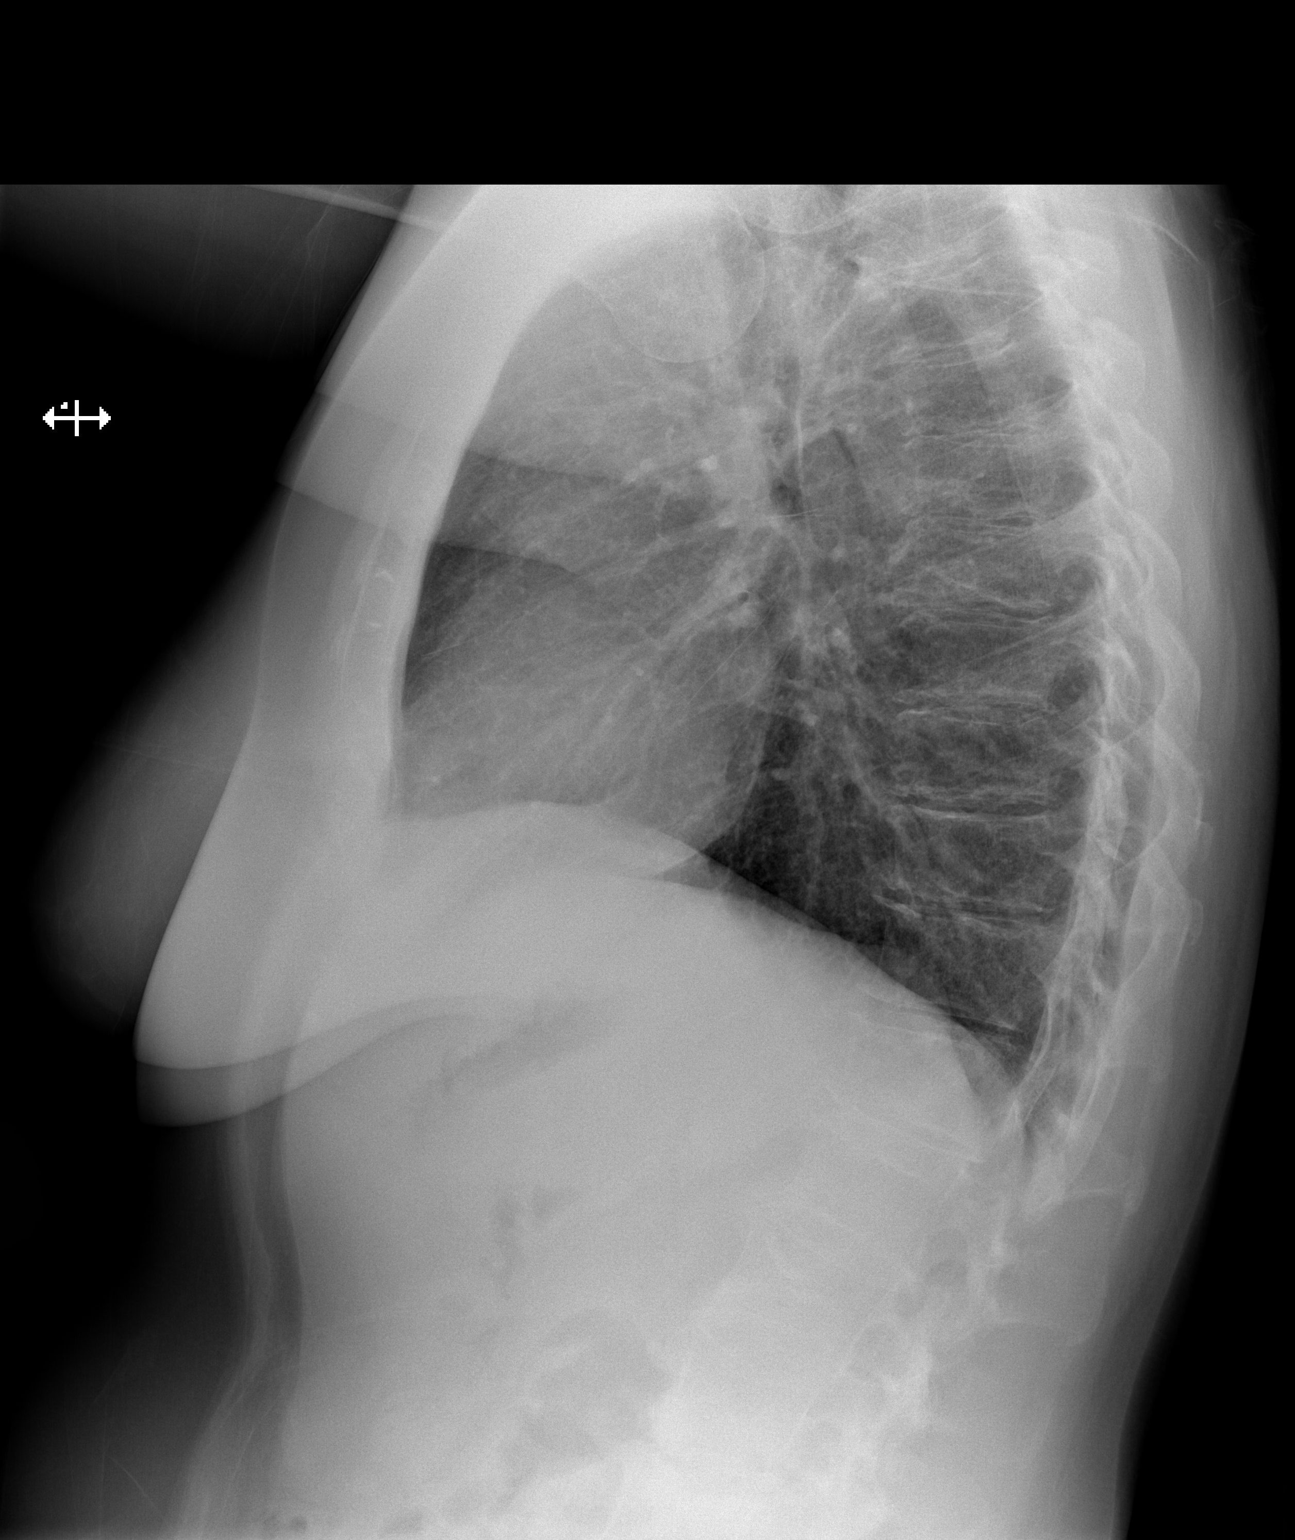

[2 of 2 positions shown; findings below may reference images not displayed]

FINDINGS: Cardiomediastinal silhouette is stable.  No acute
infiltrate or pleural effusion.  No pulmonary edema. Mild
degenerative changes thoracic spine.  There is  levoscoliosis and
degenerative changes lumbar spine.
IMPRESSION: No acute infiltrate or pleural effusion.  No pulmonary edema. Mild
degenerative changes thoracic spine.  There is  levoscoliosis and
degenerative changes lumbar spine.]

## 2015-01-10 ENCOUNTER — Ambulatory Visit (INDEPENDENT_AMBULATORY_CARE_PROVIDER_SITE_OTHER): Payer: BLUE CROSS/BLUE SHIELD | Admitting: Internal Medicine

## 2015-01-10 ENCOUNTER — Encounter: Payer: Self-pay | Admitting: Internal Medicine

## 2015-01-10 VITALS — BP 112/78 | HR 84 | Temp 97.6°F | Ht 65.0 in | Wt 196.4 lb

## 2015-01-10 DIAGNOSIS — Z114 Encounter for screening for human immunodeficiency virus [HIV]: Secondary | ICD-10-CM

## 2015-01-10 DIAGNOSIS — Z23 Encounter for immunization: Secondary | ICD-10-CM | POA: Diagnosis not present

## 2015-01-10 DIAGNOSIS — Z0000 Encounter for general adult medical examination without abnormal findings: Secondary | ICD-10-CM | POA: Diagnosis not present

## 2015-01-10 DIAGNOSIS — Z09 Encounter for follow-up examination after completed treatment for conditions other than malignant neoplasm: Secondary | ICD-10-CM

## 2015-01-10 DIAGNOSIS — Z1159 Encounter for screening for other viral diseases: Secondary | ICD-10-CM

## 2015-01-10 MED ORDER — MONTELUKAST SODIUM 10 MG PO TABS: 10.0000 mg | ORAL_TABLET | Freq: Every day | ORAL | Status: DC

## 2015-01-10 NOTE — Progress Notes (Signed)
Pre visit review using our clinic review tool, if applicable. No additional management support is needed unless otherwise documented below in the visit note. 

## 2015-01-10 NOTE — Progress Notes (Signed)
Subjective:    Patient ID: [name] [name], female    DOB: 14-Feb-1961, 54 y.o.   MRN: [id number]  DOS:  01/10/2015 Type of visit - description :  CPX Interval history: in general doing well. See review of systems    Review of Systems Constitutional: No fever. No chills. No unexplained wt changes. No unusual sweats  HEENT: No dental problems, no ear discharge, no facial swelling, no voice changes. No eye discharge, no eye  redness , no  intolerance to light   Respiratory: No wheezing , no  difficulty breathing. No cough , no mucus production  Cardiovascular: No CP, no leg swelling , no  Palpitations  GI: no nausea, no vomiting, no abdominal pain.  No blood in the stools. No dysphagia, no odynophagia For the last 2 months reports more frequent bowel movements, stools are loose.No unexplained wt loss, no recent antibiotic use    Endocrine: No polyphagia, no polyuria , no polydipsia  GU: No dysuria, gross hematuria, difficulty urinating. No urinary urgency, no frequency.  Musculoskeletal: No joint swellings or unusual aches or pains  Skin: No change in the color of the skin, palor , no  Rash  Allergic, immunologic: + nasal congestion and postnasal dripping not helped by Claritin or Allegra  Neurological: No dizziness no  syncope. No headaches. No diplopia, no slurred, no slurred speech, no motor deficits, no facial  Numbness  Hematological: No enlarged lymph nodes, no easy bruising , no unusual bleedings  Psychiatry: No suicidal ideas, no hallucinations, no beavior problems, no confusion.  No unusual/severe anxiety, no depression   Past Medical History  Diagnosis Date  . Anxiety and depression   . Fatty liver     CT 12-2009   fatty change of the liver,  benign appearing 6 cm, cyst lower pole left kidney (incidental findings)  . Varicella   . Arthritis     hips,knees, & back  . Menometrorrhagia 01/05/08  . Perimenopausal 01/05/08  . Goiter 05/2011  . Back pain   .  Heart murmur   . Pneumonia     hx of  . GERD (gastroesophageal reflux disease)   . Headache(784.0)   . Blood transfusion without reported diagnosis   . Wart 07/2014    Left medial bicep, treated with cryotherapy, Dr. Altamese Cabal  . Actinic keratosis 07/2014    Midline Lower Lip, treated with cryotherapy, Dr. Altamese Cabal  . 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 Altamese Cabal  . Cramp of limb     Past Surgical History  Procedure Laterality Date  . Foot surgery  1995    left   . 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 Tonsillectomy  1969  . Adnoids  1969  . Cesarean section  1999  . Tubal ligation  2000    bilateral  . Lumbar percutaneous pedicle screw 4 level N/A 11/12/2012    Procedure: Thoracic Ten to Sacral One Percutaneous pedicle screw fixation with only bonegraft at the thoracolumbar junction;  Surgeon: Erline Levine, MD;  Location: [place] [facility];  Service: Neurosurgery;  Laterality: N/A;  T10 to S1 Percutaneous pedicle screw fixation with only bonegraft at the thoracolumbar junction  . Anterior lumbar fusion N/A 11/11/2012    Procedure: Lumbar five-sacral one Anterior Lumbar Interbody Fusion;  Surgeon: Erline Levine, MD;  Location: [place] [facility];  Service: Neurosurgery;  Laterality: N/A;  L5-S1 Anterior lumbar interbody fusion with Dr.  Brabham for approach  . Anterior lat lumbar fusion Right 11/11/2012    Procedure: Lumbar two-three, Lumbar three-four, Lumbar four-five Anterolateral decompression and Fusion ;  Surgeon: Erline Levine, MD;  Location: [place] [facility];  Service: Neurosurgery;  Laterality: Right;  Lumbar two-three, Lumbar three-four, Lumbar four-five Anterolateral decompression and Fusion   . Abdominal exposure N/A 11/11/2012    Procedure: Abdominal/Anterior Exposure;  Surgeon: Serafina Mitchell, MD;  Location: [facility];  Service: Vascular;  Laterality: N/A;    Social History   Social History  . Marital Status: Married     Spouse Name: N/A  . Number of Children: 1  . Years of Education: N/A   Occupational History  . works from home for a [employer]    Social History Main Topics  . Smoking status: Former Smoker    Quit date: 11/09/2006  . Smokeless tobacco: Never Used     Comment: quit 2008, used to snoke 1 ppd  . Alcohol Use: 0.0 oz/week    3-4 Glasses of wine per week     Comment: social   . Drug Use: No  . Sexual Activity:    Partners: Male    Birth Control/ Protection: Surgical     Comment: BTL   Other Topics Concern  . Not on file   Social History Narrative   Married, 1 daughter               Family History  Problem Relation Age of Onset  . Adopted: Yes  . Cancer Mother     lung and thyroid  . Cancer Father     type?  . Breast cancer Neg Hx   . Colon cancer Neg Hx   . Diabetes Neg Hx   . CAD Neg Hx   . Rectal cancer Neg Hx   . Stomach cancer Neg Hx        Medication List       This list is accurate as of: 01/10/15 11:59 PM.  Always use your most recent med list.               CALCIUM-MAGNESIUM-VITAMIN D PO  Take 2 tablets by mouth daily. Calcium-Vitamin D-Zinc-Magnesium     loratadine 10 MG tablet  Commonly known as:  CLARITIN  Take 10 mg by mouth daily as needed.     montelukast 10 MG tablet  Commonly known as:  SINGULAIR  Take 1 tablet (10 mg total) by mouth at bedtime.     omeprazole 20 MG tablet  Commonly known as:  PRILOSEC OTC  Take 20 mg by mouth daily.           Objective:   Physical Exam BP 112/78 mmHg  Pulse 84  Temp(Src) 97.6 F (36.4 C) (Oral)  Ht 5\' 5"  (1.651 m)  Wt 196 lb 6 oz (89.075 kg)  BMI 32.68 kg/m2  SpO2 98%  LMP 05/02/2011 General:   Well developed, well nourished . NAD.  Neck:  Full range of motion. Supple. + right-sided thyromegaly without nodules. Not tender  HEENT:  Normocephalic . Face symmetric, atraumatic. Nose is slightly congested Lungs:  CTA B Normal respiratory effort, no intercostal retractions, no  accessory muscle use. Heart: RRR,  no murmur.  No pretibial edema bilaterally  Abdomen:  Not distended, soft, non-tender. No rebound or rigidity. No mass,organomegaly Skin: Exposed areas without rash. Not pale. Not jaundice Neurologic:  alert & oriented X3.  Speech normal, gait appropriate for age and unassisted Strength symmetric  and appropriate for age.  Psych: Cognition and judgment appear intact.  Cooperative with normal attention span and concentration.  Behavior appropriate. No anxious or depressed appearing.    Assessment & Plan:   Assessment> Anxiety depression Fatty liver CT 2011  Allergic rhinitis DJD H/o back surgery Perimenopausal , 2009 Goiter >>> bx 05-2011 and 10-2013 -->benign Skin: actinic keratosis H/o heart murmur Has  seen dermatology for skin lesions CT 2011: Benign-appearing 6 cm cyst lower pole left kidney (seen again MRI 2014 before back surgery per pt, no report)  Plan: Allergies, not well controlled with Allegra, add Flonase and if needed  singular. Loose stools: See history of present illness. Recommend probiotics unchanged Benefiber to Metamucil. If not improving she will let me know

## 2015-01-10 NOTE — Assessment & Plan Note (Addendum)
Td 01-2011 , flu shot today    Female care, [facility] per gyn , Dr Leo Grosser  Never done a DEXA Labs ; FLP, BMP, TSH, HIV, hep C Diet exercise discussed

## 2015-01-10 NOTE — Patient Instructions (Signed)
  Please schedule labs to be done within few days (fasting)   For allergies: Use Flonase 2 sprays in each side of the nose every day consistently and continue Claritin. Add   Singulair if not improving in the next few days  Diarrhea: Start a probiotic such as Florastor daily Change to Metamucil 2 capsules twice a day If not improving in the next 3 weeks: Please call the office    Next visit  for acomplete physical exam in one year. Fasting.     Please schedule an appointment at the front desk

## 2015-01-11 DIAGNOSIS — Z09 Encounter for follow-up examination after completed treatment for conditions other than malignant neoplasm: Secondary | ICD-10-CM | POA: Insufficient documentation

## 2015-01-11 NOTE — Assessment & Plan Note (Signed)
Allergies, not well controlled with Allegra, add Flonase and if needed  singular. Loose stools: See history of present illness. Recommend probiotics unchanged Benefiber to Metamucil. If not improving she will let me know

## 2015-01-12 ENCOUNTER — Other Ambulatory Visit (INDEPENDENT_AMBULATORY_CARE_PROVIDER_SITE_OTHER): Payer: BLUE CROSS/BLUE SHIELD

## 2015-01-12 DIAGNOSIS — Z0000 Encounter for general adult medical examination without abnormal findings: Secondary | ICD-10-CM | POA: Diagnosis not present

## 2015-01-12 DIAGNOSIS — Z1159 Encounter for screening for other viral diseases: Secondary | ICD-10-CM

## 2015-01-12 DIAGNOSIS — Z114 Encounter for screening for human immunodeficiency virus [HIV]: Secondary | ICD-10-CM

## 2015-01-12 LAB — BASIC METABOLIC PANEL
BUN: 14 mg/dL (ref 6–23)
CHLORIDE: 105 meq/L (ref 96–112)
CO2: 26 meq/L (ref 19–32)
Calcium: 9.7 mg/dL (ref 8.4–10.5)
Creatinine, Ser: 0.73 mg/dL (ref 0.40–1.20)
GFR: 89 mL/min (ref >60.00)
Glucose, Bld: 96 mg/dL (ref 70–99)
POTASSIUM: 4 meq/L (ref 3.5–5.1)
Sodium: 140 mEq/L (ref 135–145)

## 2015-01-12 LAB — LIPID PANEL
Cholesterol: 202 mg/dL — ABNORMAL HIGH (ref 0–200)
HDL: 55.2 mg/dL (ref >39.00)
LDL CALC: 115 mg/dL — AB (ref 0–99)
NonHDL: 146.87
TRIGLYCERIDES: 159 mg/dL — AB (ref 0.0–149.0)
Total CHOL/HDL Ratio: 4
VLDL: 31.8 mg/dL (ref 0.0–40.0)

## 2015-01-12 LAB — TSH: TSH: 1.92 u[IU]/mL (ref 0.35–4.50)

## 2015-01-13 LAB — HEPATITIS C ANTIBODY: HCV Ab: NEGATIVE

## 2015-01-13 LAB — HIV ANTIBODY (ROUTINE TESTING W REFLEX): HIV: NONREACTIVE

## 2015-09-28 ENCOUNTER — Encounter: Payer: Self-pay | Admitting: Internal Medicine

## 2015-09-28 ENCOUNTER — Ambulatory Visit (INDEPENDENT_AMBULATORY_CARE_PROVIDER_SITE_OTHER): Payer: BLUE CROSS/BLUE SHIELD | Admitting: Internal Medicine

## 2015-09-28 VITALS — BP 124/80 | HR 50 | Temp 98.0°F | Ht 65.0 in | Wt 199.1 lb

## 2015-09-28 DIAGNOSIS — R030 Elevated blood-pressure reading, without diagnosis of hypertension: Secondary | ICD-10-CM

## 2015-09-28 DIAGNOSIS — G44209 Tension-type headache, unspecified, not intractable: Secondary | ICD-10-CM | POA: Diagnosis not present

## 2015-09-28 DIAGNOSIS — F418 Other specified anxiety disorders: Secondary | ICD-10-CM

## 2015-09-28 DIAGNOSIS — F329 Major depressive disorder, single episode, unspecified: Secondary | ICD-10-CM

## 2015-09-28 DIAGNOSIS — F419 Anxiety disorder, unspecified: Principal | ICD-10-CM

## 2015-09-28 DIAGNOSIS — F32A Depression, unspecified: Secondary | ICD-10-CM

## 2015-09-28 DIAGNOSIS — IMO0001 Reserved for inherently not codable concepts without codable children: Secondary | ICD-10-CM

## 2015-09-28 MED ORDER — LORAZEPAM 1 MG PO TABS: 0.5000 mg | ORAL_TABLET | Freq: Every day | ORAL | Status: DC | PRN

## 2015-09-28 MED ORDER — FLUOXETINE HCL 20 MG PO TABS: 20.0000 mg | ORAL_TABLET | Freq: Every day | ORAL | Status: DC

## 2015-09-28 NOTE — Assessment & Plan Note (Signed)
Headache: No red flag symptoms, tensional? Will treat anxiety first then further eval if the HA persist Anxiety depression: Related to work, a daughter going to college. Used to take meds w/ great resultsr: restart Prozac 20 mg, Ativan once a day as needed for anxiety and difficulty sleeping. Also recommend counseling Elevated BP: Never was more than 145/95, she has been taking HCTZ for 3 days, she is not convinced needs meds, i agree. We will stop medicines, monitor BPs, consider restart something like amlodipine. See instructions. RTC one month.

## 2015-09-28 NOTE — Progress Notes (Signed)
Subjective:    Patient ID: [name] [name], female    DOB: 1960-04-16, 55 y.o.   MRN: [id number]  DOS:  09/28/2015 Type of visit - description : Follow-up Interval history: Patient reports the following history. Few weeks ago noted that her headaches were slightly more frequent than before, sometimes at the forehead, sometimes on the back of the head. HA might  last few hours, sometimes decreased with Tylenol. Not the worse headache of her life, no associated nausea and photophobia.  This week, felt something like a ringing in the ears, decided to start monitoring her BPs, they were as high as 145/95. Went to urgent care 09/24/2015, BP 140/85, prescribed HCTZ.  When asked about her mood she reported is stressed:  work related, her daughter is going to [employer], she become very emotional and start crying. "I think is stress, I think I need to go back on medication".  BP Readings from Last 3 Encounters:  09/28/15 124/80  01/10/15 112/78  07/03/14 130/84     Review of Systems  Denies fever chills No sinus pain or congestion Has a difficult time falling asleep. No suicidal ideas.  Past Medical History  Diagnosis Date  . Anxiety and depression   . Fatty liver     CT 12-2009   fatty change of the liver,  benign appearing 6 cm, cyst lower pole left kidney (incidental findings)  . Varicella   . Arthritis     hips,knees, & back  . Menometrorrhagia 01/05/08  . Perimenopausal 01/05/08  . Goiter 05/2011  . Back pain   . Heart murmur   . Pneumonia     hx of  . GERD (gastroesophageal reflux disease)   . Headache(784.0)   . Blood transfusion without reported diagnosis   . Wart 07/2014    Left medial bicep, treated with cryotherapy, Dr. Altamese Cabal  . Actinic keratosis 07/2014    Midline Lower Lip, treated with cryotherapy, Dr. Altamese Cabal  . 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 Altamese Cabal  . Cramp of limb     Past Surgical History    Procedure Laterality Date  . Foot surgery  1995    left   . 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 Tonsillectomy  1969  . Adnoids  1969  . Cesarean section  1999  . Tubal ligation  2000    bilateral  . Lumbar percutaneous pedicle screw 4 level N/A 11/12/2012    Procedure: Thoracic Ten to Sacral One Percutaneous pedicle screw fixation with only bonegraft at the thoracolumbar junction;  Surgeon: Erline Levine, MD;  Location: [facility];  Service: Neurosurgery;  Laterality: N/A;  T10 to S1 Percutaneous pedicle screw fixation with only bonegraft at the thoracolumbar junction  . Anterior lumbar fusion N/A 11/11/2012    Procedure: Lumbar five-sacral one Anterior Lumbar Interbody Fusion;  Surgeon: Erline Levine, MD;  Location: [facility];  Service: Neurosurgery;  Laterality: N/A;  L5-S1 Anterior lumbar interbody fusion with Dr. Trula Slade for approach  . Anterior lat lumbar fusion Right 11/11/2012    Procedure: Lumbar two-three, Lumbar three-four, Lumbar four-five Anterolateral decompression and Fusion ;  Surgeon: Erline Levine, MD;  Location: [place] [facility];  Service: Neurosurgery;  Laterality: Right;  Lumbar two-three, Lumbar three-four, Lumbar four-five Anterolateral decompression and Fusion   . Abdominal exposure N/A 11/11/2012    Procedure: Abdominal/Anterior Exposure;  Surgeon: Serafina Mitchell, MD;  Location: [facility]  [facility];  Service: Vascular;  Laterality: N/A;    Social History   Social History  . Marital Status: Married    Spouse Name: N/A  . Number of Children: 1  . Years of Education: N/A   Occupational History  . works from home for a [employer]    Social History Main Topics  . Smoking status: Former Smoker    Quit date: 11/09/2006  . Smokeless tobacco: Never Used     Comment: quit 2008, used to snoke 1 ppd  . Alcohol Use: 0.0 oz/week    3-4 Glasses of wine per week     Comment: social   . Drug Use: No  . Sexual Activity:    Partners: Male    Birth Control/  Protection: Surgical     Comment: BTL   Other Topics Concern  . Not on file   Social History Narrative   Married, 1 daughter                  Medication List       This list is accurate as of: 09/28/15  8:49 PM.  Always use your most recent med list.               CALCIUM-MAGNESIUM-VITAMIN D PO  Take 2 tablets by mouth daily. Calcium-Vitamin D-Zinc-Magnesium     FLUoxetine 20 MG tablet  Commonly known as:  PROZAC  Take 1 tablet (20 mg total) by mouth daily.     loratadine 10 MG tablet  Commonly known as:  CLARITIN  Take 10 mg by mouth daily as needed.     LORazepam 1 MG tablet  Commonly known as:  ATIVAN  Take 0.5-1 tablets (0.5-1 mg total) by mouth daily as needed for anxiety or sleep.     montelukast 10 MG tablet  Commonly known as:  SINGULAIR  Take 1 tablet (10 mg total) by mouth at bedtime.     omeprazole 20 MG tablet  Commonly known as:  PRILOSEC OTC  Take 20 mg by mouth daily.           Objective:   Physical Exam BP 124/80 mmHg  Pulse 50  Temp(Src) 98 F (36.7 C) (Oral)  Ht 5\' 5"  (1.651 m)  Wt 199 lb 2 oz (90.323 kg)  BMI 33.14 kg/m2  SpO2 98%  LMP 05/02/2011 General:   Well developed, well nourished . NAD.  HEENT:  Normocephalic . Face symmetric, atraumatic. Neck: Full range of motion Lungs:  CTA B Normal respiratory effort, no intercostal retractions, no accessory muscle use. Heart: RRR,  no murmur.  No pretibial edema bilaterally  Skin: Not pale. Not jaundice Neurologic:  alert & oriented X3.  Speech normal, gait appropriate for age and unassisted. Motor exam normal Psych--  Cognition and judgment appear intact.  Cooperative with normal attention span and concentration.  Emotional, tearful during parts of the visit     Assessment & Plan:   Assessment> Anxiety depression Fatty liver CT 2011  Allergic rhinitis DJD H/o back surgery Perimenopausal , 2009 Goiter >>> bx 05-2011 and 10-2013 -->benign Skin: actinic keratosis,  has seen derm H/o heart murmur CT 2011: Benign-appearing 6 cm cyst lower pole left kidney (seen again MRI 2014 before back surgery per pt, no report)  PLAN: Headache: No red flag symptoms, tensional? Will treat anxiety first then further eval if the HA persist Anxiety depression: Related to work, a daughter going to college. Used to take meds w/ great resultsr: restart Prozac  20 mg, Ativan once a day as needed for anxiety and difficulty sleeping. Also recommend counseling Elevated BP: Never was more than 145/95, she has been taking HCTZ for 3 days, she is not convinced needs meds, i agree. We will stop medicines, monitor BPs, consider restart something like amlodipine. See instructions. RTC one month.

## 2015-09-28 NOTE — Patient Instructions (Signed)
GO TO THE FRONT DESK Schedule your next appointment for a checkup in 4 weeks.      Check the  blood pressure 2 or 3 times a  week Be sure your blood pressure is between 110/65 and  145/85. If it is consistently higher or lower, let me know    Start fluoxetine 20 mg: The first week take only half tablet, then one tablet daily Ativan as needed;  if you take it during the daytime watch for excessive sedation  Consider see a counselor  If your headaches get worse, intense, frequent or different: Go to urgent care or call the office

## 2015-09-28 NOTE — Progress Notes (Signed)
Pre visit review using our clinic review tool, if applicable. No additional management support is needed unless otherwise documented below in the visit note. 

## 2015-11-05 ENCOUNTER — Encounter: Payer: Self-pay | Admitting: Internal Medicine

## 2015-11-05 ENCOUNTER — Ambulatory Visit (INDEPENDENT_AMBULATORY_CARE_PROVIDER_SITE_OTHER): Payer: BLUE CROSS/BLUE SHIELD | Admitting: Internal Medicine

## 2015-11-05 VITALS — BP 122/76 | HR 67 | Temp 98.2°F | Resp 12 | Ht 65.0 in | Wt 201.0 lb

## 2015-11-05 DIAGNOSIS — F418 Other specified anxiety disorders: Secondary | ICD-10-CM

## 2015-11-05 DIAGNOSIS — F32A Depression, unspecified: Secondary | ICD-10-CM

## 2015-11-05 DIAGNOSIS — G44209 Tension-type headache, unspecified, not intractable: Secondary | ICD-10-CM | POA: Diagnosis not present

## 2015-11-05 DIAGNOSIS — F329 Major depressive disorder, single episode, unspecified: Secondary | ICD-10-CM

## 2015-11-05 DIAGNOSIS — F419 Anxiety disorder, unspecified: Principal | ICD-10-CM

## 2015-11-05 NOTE — Progress Notes (Signed)
Subjective:    Patient ID: [name] [name], female    DOB: 04-17-1960, 55 y.o.   MRN: [id number]  DOS:  11/05/2015 Type of visit - description : Follow-up from previous visit Interval history: Started fluoxetine, feeling better. Taking Ativan on average twice a day. BP was elevated, she has been checking frequently and is normal. Had headaches: Better compared to last time.   Review of Systems  No nausea, vomiting, diarrhea. No chest pain or difficulty breathing Past Medical History:  Diagnosis Date  . Actinic keratosis 07/2014   Midline Lower Lip, treated with cryotherapy, Dr. Altamese Cabal  . Anxiety and depression   . Arthritis    hips,knees, & back  . Back pain   . Blood transfusion without reported diagnosis   . Cramp of limb   . Fatty liver    CT 12-2009   fatty change of the liver,  benign appearing 6 cm, cyst lower pole left kidney (incidental findings)  . Fibroepithelial polyp 07/2014   On neck and left axilla were surgically destroyed, Dr. Altamese Cabal  . GERD (gastroesophageal reflux disease)   . Goiter 05/2011  . Headache(784.0)   . Heart murmur   . Menometrorrhagia 01/05/08  . Perimenopausal 01/05/08  . Pneumonia    hx of  . Varicella   . Wart 07/2014   Left medial bicep, treated with cryotherapy, Dr. Altamese Cabal    Past Surgical History:  Procedure Laterality Date  . ABDOMINAL EXPOSURE N/A 11/11/2012   Procedure: Abdominal/Anterior Exposure;  Surgeon: Serafina Mitchell, MD;  Location: [facility];  Service: Vascular;  Laterality: N/A;  . adnoids  1969  . ANTERIOR LAT LUMBAR FUSION Right 11/11/2012   Procedure: Lumbar two-three, Lumbar three-four, Lumbar four-five Anterolateral decompression and Fusion ;  Surgeon: Erline Levine, MD;  Location: [place] [facility];  Service: Neurosurgery;  Laterality: Right;  Lumbar two-three, Lumbar three-four, Lumbar four-five Anterolateral decompression and Fusion   . ANTERIOR LUMBAR FUSION N/A 11/11/2012   Procedure: Lumbar five-sacral one  Anterior Lumbar Interbody Fusion;  Surgeon: Erline Levine, MD;  Location: [place] [facility];  Service: Neurosurgery;  Laterality: N/A;  L5-S1 Anterior lumbar interbody fusion with Dr. Trula Slade for approach  . CESAREAN SECTION  1999  . [place]   left   . LUMBAR PERCUTANEOUS PEDICLE SCREW 4 LEVEL N/A 11/12/2012   Procedure: Thoracic Ten to Sacral One Percutaneous pedicle screw fixation with only bonegraft at the thoracolumbar junction;  Surgeon: Erline Levine, MD;  Location: [place] [facility];  Service: Neurosurgery;  Laterality: N/A;  T10 to S1 Percutaneous pedicle screw fixation with only bonegraft at the thoracolumbar junction  . TONSILLECTOMY  1969  . TRACHEOSTOMY  1968   due to severe allergic rxn   . TUBAL LIGATION  2000   bilateral    Social History   Social History  . Marital status: Married    Spouse name: N/A  . Number of children: 1  . Years of education: N/A   Occupational History  . works from home for a [employer]    Social History Main Topics  . Smoking status: Former Smoker    Quit date: 11/09/2006  . Smokeless tobacco: Never Used     Comment: quit 2008, used to snoke 1 ppd  . Alcohol use 0.0 oz/week    3 - 4 Glasses of wine per week     Comment: social   . Drug use: No  . Sexual activity: Yes    Partners: Male  Birth control/ protection: Surgical     Comment: BTL   Other Topics Concern  . Not on file   Social History Narrative   Married, 1 daughter                  Medication List       Accurate as of 11/05/15  5:00 PM. Always use your most recent med list.          CALCIUM-MAGNESIUM-VITAMIN D PO Take 2 tablets by mouth daily. Calcium-Vitamin D-Zinc-Magnesium   FIBER CHOICE PO Take by mouth daily.   FLUoxetine 20 MG tablet Commonly known as:  PROZAC Take 1 tablet (20 mg total) by mouth daily.   loratadine 10 MG tablet Commonly known as:  CLARITIN Take 10 mg by mouth daily as needed.   LORazepam 1 MG tablet Commonly known as:   ATIVAN Take 0.5-1 tablets (0.5-1 mg total) by mouth daily as needed for anxiety or sleep.   montelukast 10 MG tablet Commonly known as:  SINGULAIR Take 1 tablet (10 mg total) by mouth at bedtime.   omeprazole 20 MG tablet Commonly known as:  PRILOSEC OTC Take 20 mg by mouth daily.          Objective:   Physical Exam BP 122/76 (BP Location: Left Arm, Patient Position: Sitting, Cuff Size: Normal)   Pulse 67   Temp 98.2 F (36.8 C) (Oral)   Resp 12   Ht 5\' 5"  (1.651 m)   Wt 201 lb (91.2 kg)   LMP 05/02/2011   SpO2 94%   BMI 33.45 kg/m  General:   Well developed, well nourished . NAD.  HEENT:  Normocephalic . Face symmetric, atraumatic Skin: Not pale. Not jaundice Neurologic:  alert & oriented X3.  Speech normal, gait appropriate for age and unassisted Psych--  Cognition and judgment appear intact.  Cooperative with normal attention span and concentration.  Behavior appropriate. No anxious or depressed appearing.      Assessment & Plan:   Assessment Anxiety depression Fatty liver CT 2011  Allergic rhinitis DJD H/o back surgery Perimenopausal , 2009 Goiter >>> bx 05-2011 and 10-2013 -->benign Skin: actinic keratosis, has seen derm H/o heart murmur CT 2011: Benign-appearing 6 cm cyst lower pole left kidney (seen again MRI 2014 before back surgery per pt, no report)  PLAN: Headache: Much improved Anxiety depression: Good response to Prozac, takes Ativan on average twice a week. Will get a contract- UDS, RF prn. Patient is again counseled. Elevated BP: On no medicines, BPs have been normal. RTC 12-2015, CPX

## 2015-11-05 NOTE — Patient Instructions (Signed)
   GO TO THE FRONT DESK Schedule your next appointment for a  Physical exam by 12-2015

## 2015-11-05 NOTE — Assessment & Plan Note (Signed)
Headache: Much improved Anxiety depression: Good response to Prozac, takes Ativan on average twice a week. Will get a contract- UDS, RF prn. Patient is again counseled. Elevated BP: On no medicines, BPs have been normal. RTC 12-2015, CPX

## 2015-11-05 NOTE — Progress Notes (Signed)
Pre visit review using our clinic review tool, if applicable. No additional management support is needed unless otherwise documented below in the visit note. 

## 2015-11-14 ENCOUNTER — Telehealth: Payer: Self-pay

## 2015-11-14 NOTE — Telephone Encounter (Signed)
UDS: 11/05/2015    Negative for Lorazepam:PRN   Low risk per Dr. Larose Kells 11/13/2015

## 2015-11-15 LAB — HM MAMMOGRAPHY

## 2015-11-26 ENCOUNTER — Encounter: Payer: Self-pay | Admitting: Internal Medicine

## 2015-12-01 IMAGING — US US SOFT TISSUE HEAD/NECK
1 series · 13 of 25 positions shown · non-contrast
Comparison: 05/22/2011

CLINICAL DATA: Follow up thyromegaly. Family history of thyroid
cancer.

EXAM:
THYROID ULTRASOUND
TECHNIQUE: Ultrasound examination of the thyroid gland and adjacent soft
tissues was performed.

[Series 1: us soft tissue head/neck · 0.06mm/px · 13 of 60 slices shown]
[im 1/60]
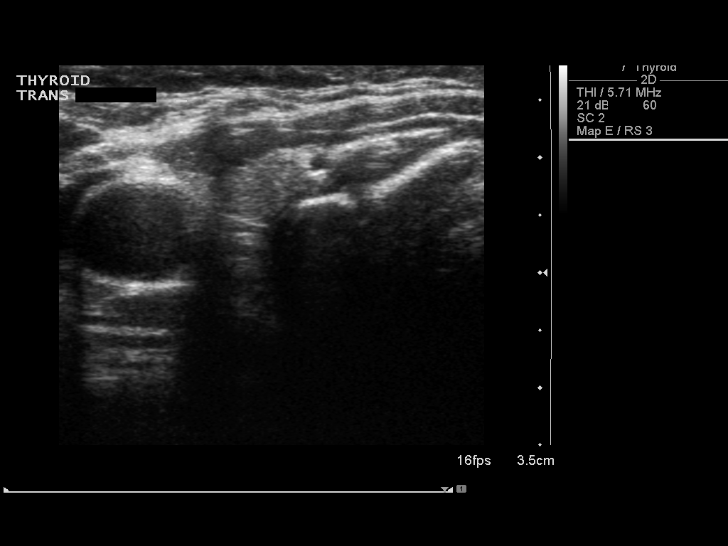
[im 5/60]
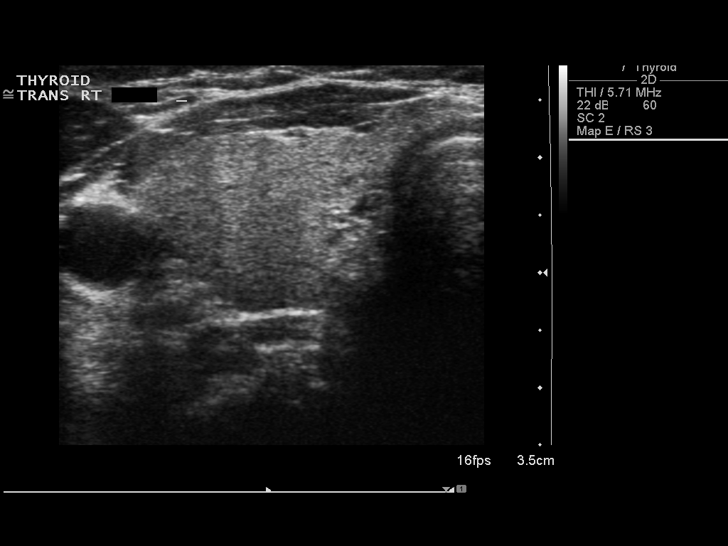
[im 10/60]
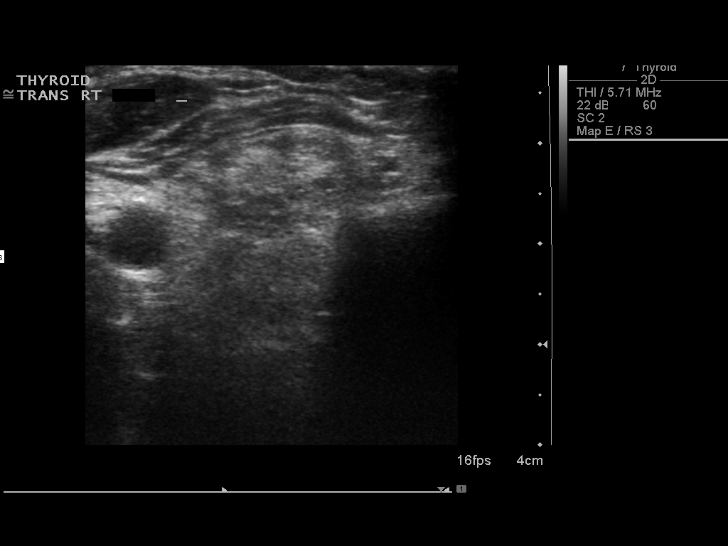
[im 15/60]
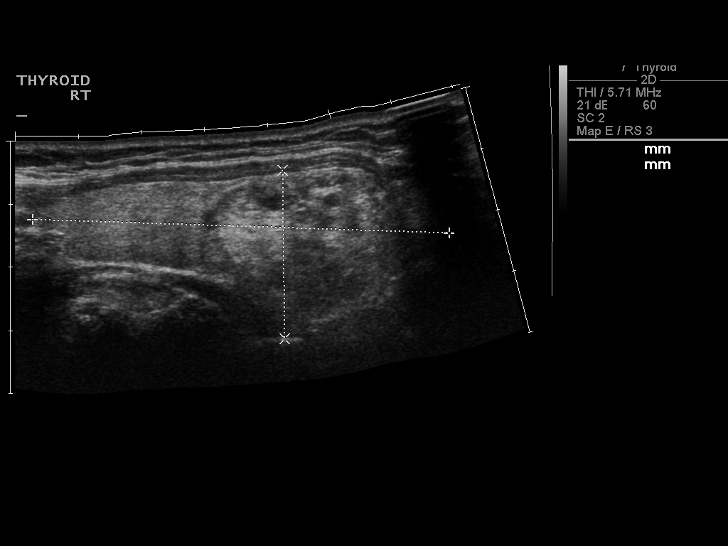
[im 20/60]
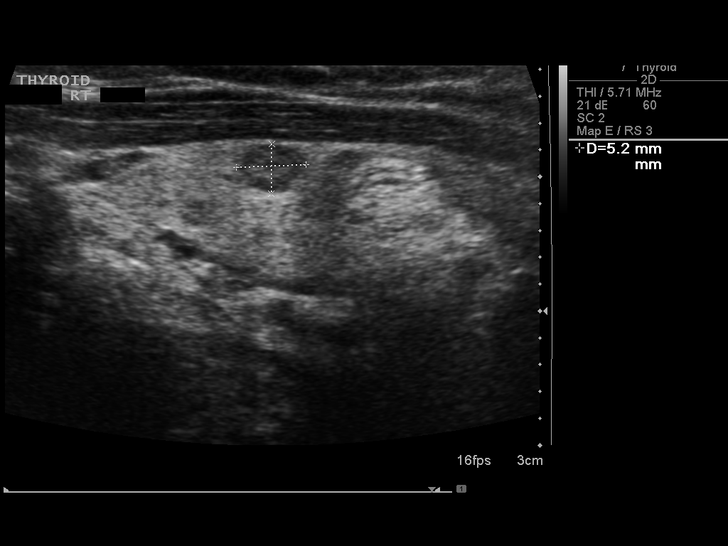
[im 25/60]
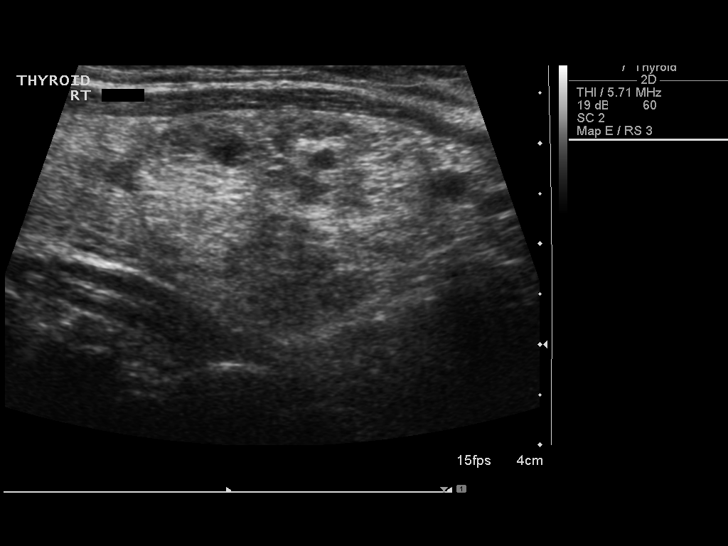
[im 30/60]
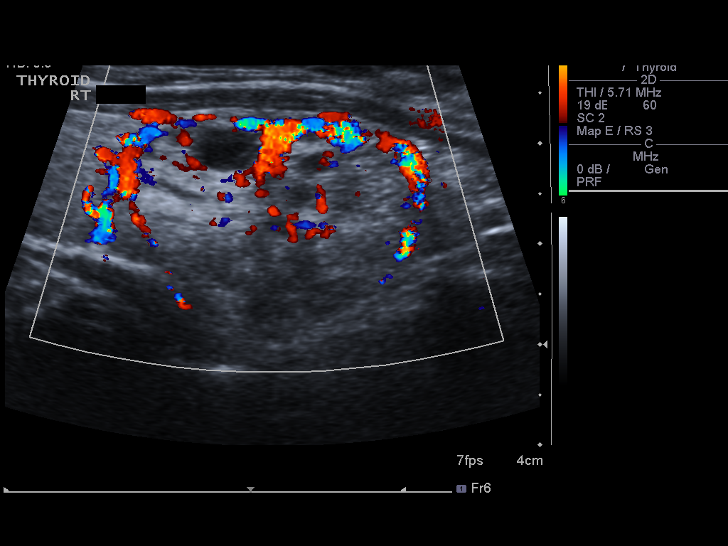
[im 35/60]
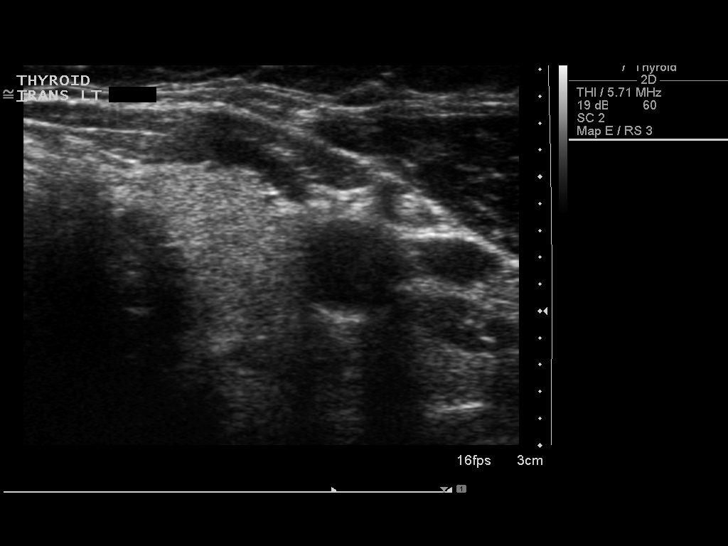
[im 40/60]
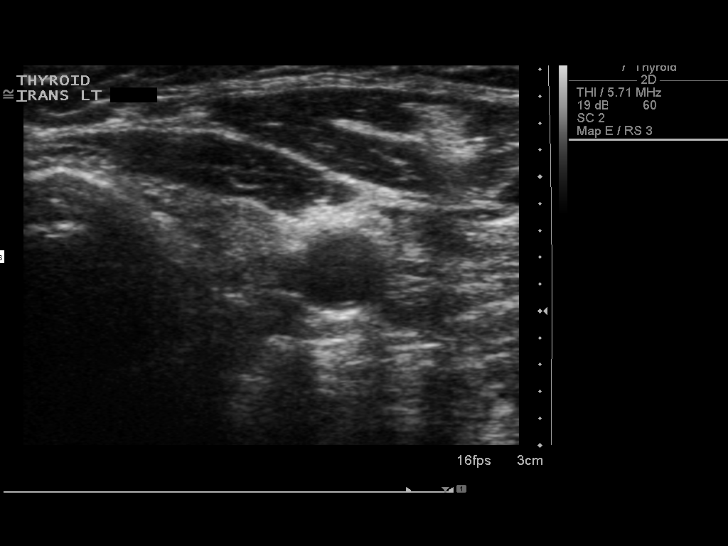
[im 45/60]
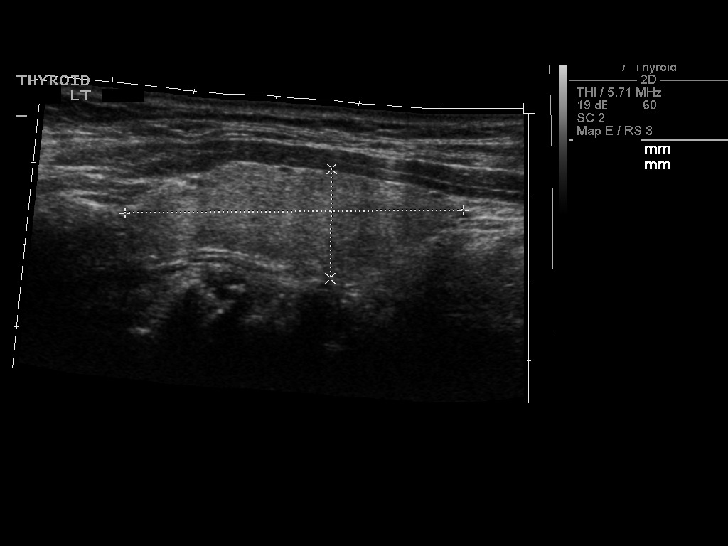
[im 50/60]
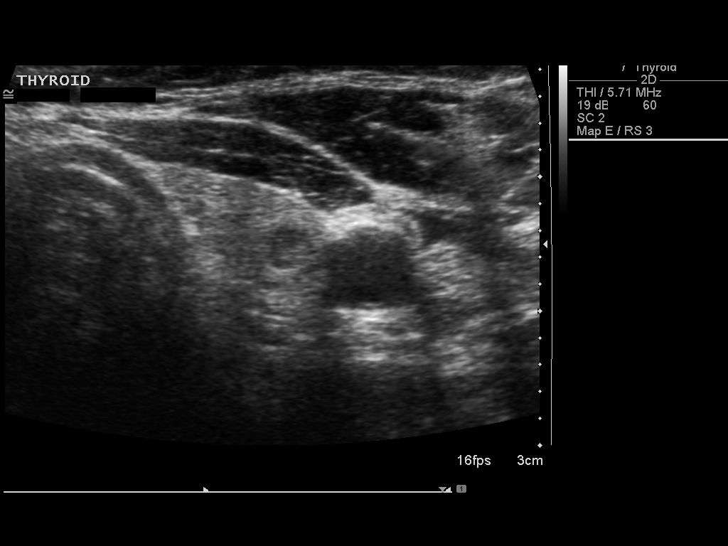
[im 55/60]
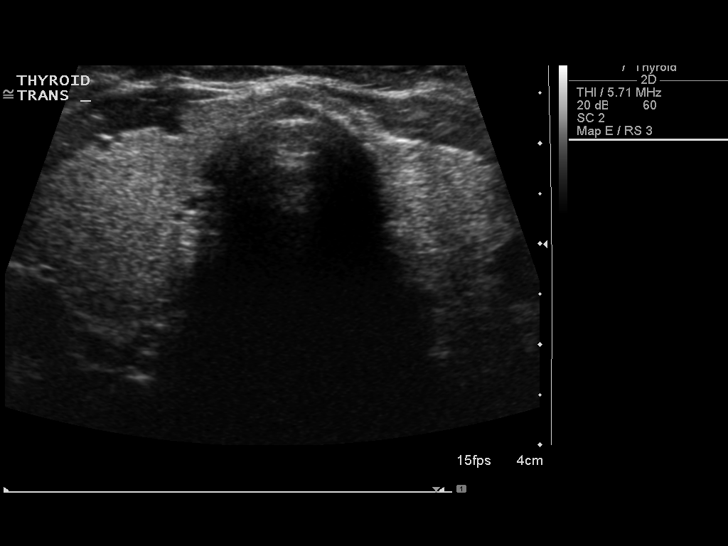
[im 60/60]
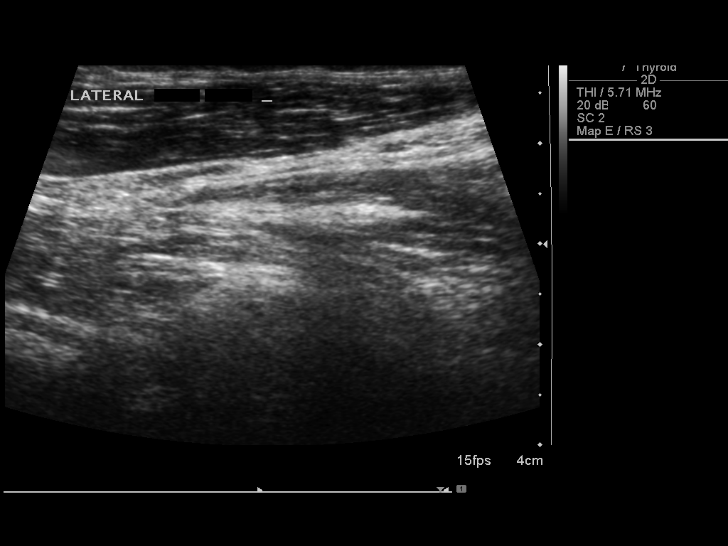

[13 of 25 positions shown; findings below may reference images not displayed]

FINDINGS: Right thyroid lobe

Measurements: 6.6 x 2.7 x 2.2 cm.. There is a heterogeneous nodule
involving the inferior right thyroid lobe. This nodule measures
x 2.3 x 3.1 cm and previously measured 3.1 x 1.9 x 1.5 cm. There is
also a hypoechoic nodule in the mid right thyroid lobe that measures
0.5 x 0.4 x 0.6 cm and unclear if this was present on the previous
examination.

Left thyroid lobe

Measurements: 4.1 x 1.3 x 1.5 cm. Overall, the left thyroid tissue
is homogeneous. There is a small hypoechoic nodule in the inferior
aspect that measures 0.5 x 0.4 x 0.3 cm. Previously, this nodule
measures 0.5 x 0.5 x 0.3 cm. There is an another small hypoechoic
structure in the superior aspect of the left thyroid lobe that
measures up to 0.3 cm.

Isthmus

Thickness: 0.2 cm.  No nodules visualized.

Lymphadenopathy

None visualized.
IMPRESSION: Bilateral thyroid nodules. The dominant nodule in the right thyroid
lobe has enlarged. Recommend ultrasound-guided fine-needle
aspiration of this dominant nodule.

## 2015-12-16 IMAGING — US US THYROID BIOPSY
1 series · 5 of 5 positions shown · non-contrast
Comparison: None.

CLINICAL DATA: Dominant right lobe nodule

EXAM:
ULTRASOUND GUIDED NEEDLE ASPIRATE BIOPSY OF THE THYROID GLAND

[Series 1: us thyroid biopsy · 5 acquisitions, 5 frames shown]
[im 1/5]
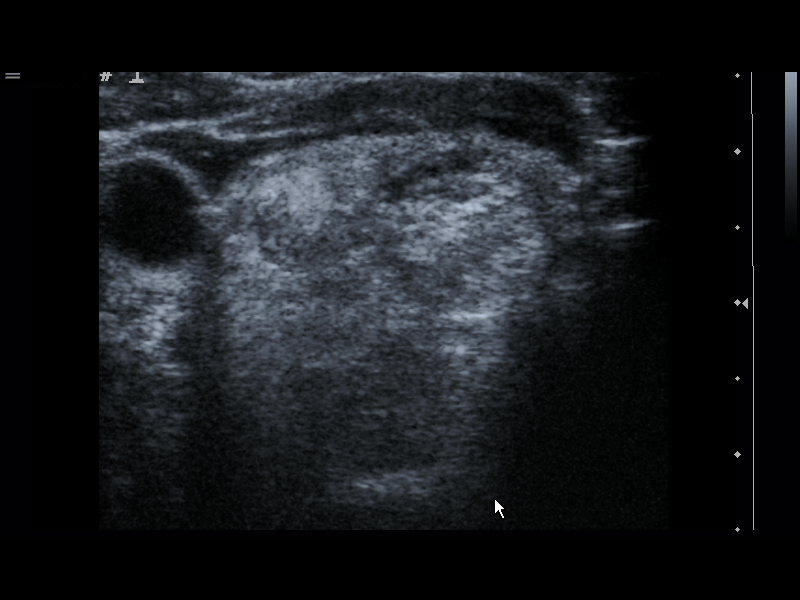
[im 2/5]
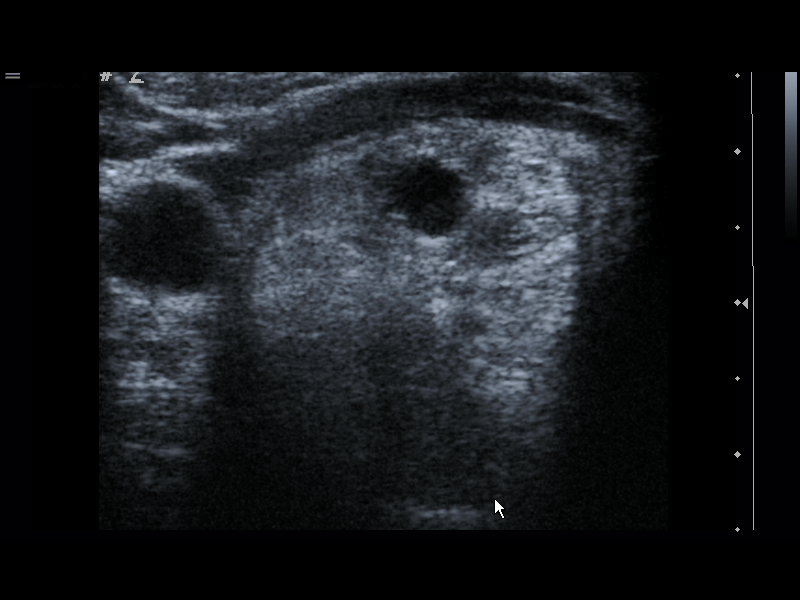
[im 3/5]
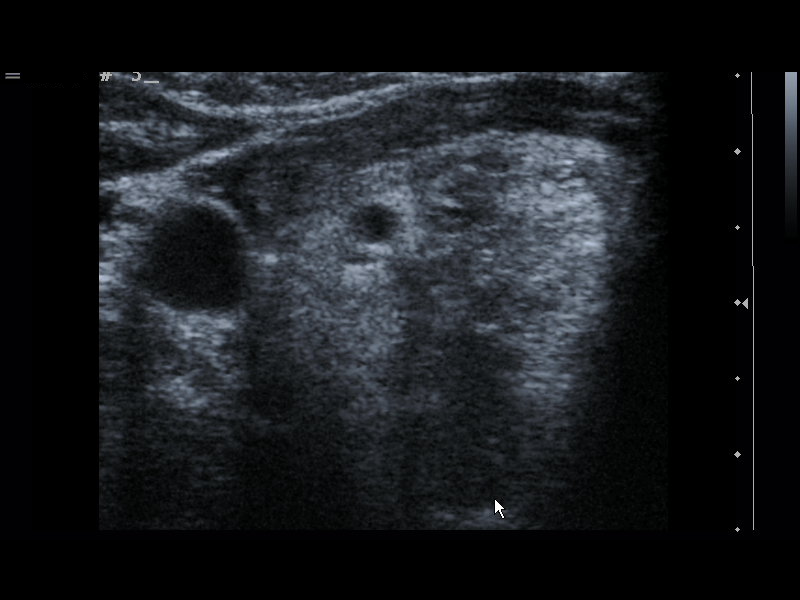
[im 4/5]
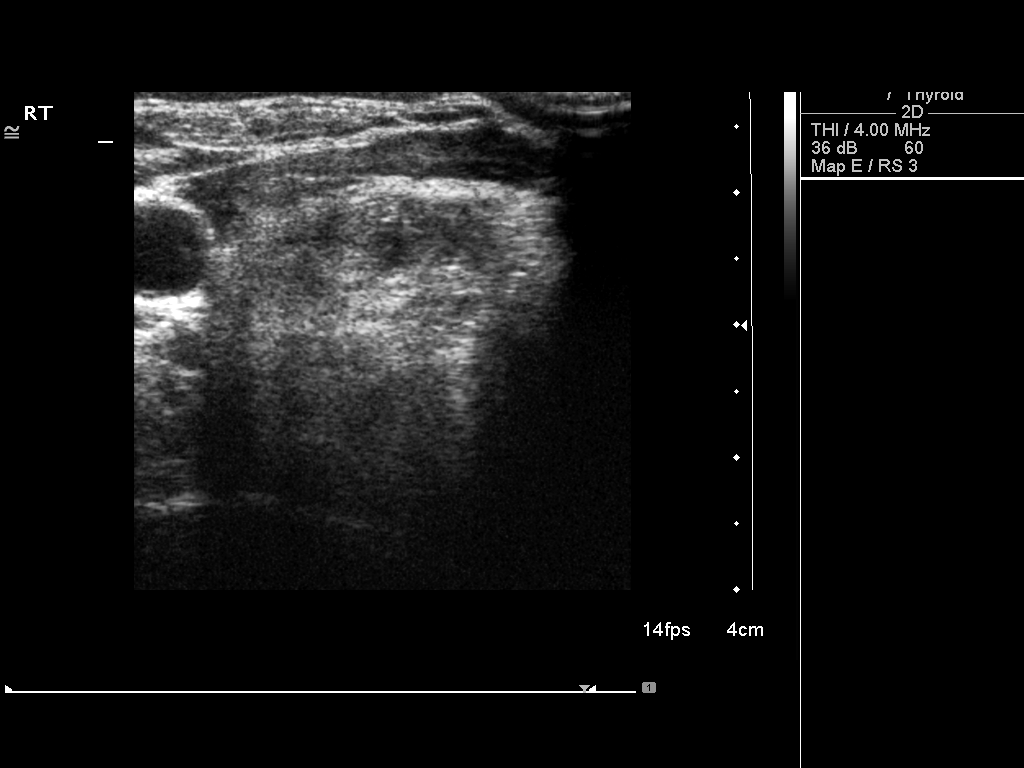
[im 5/5]
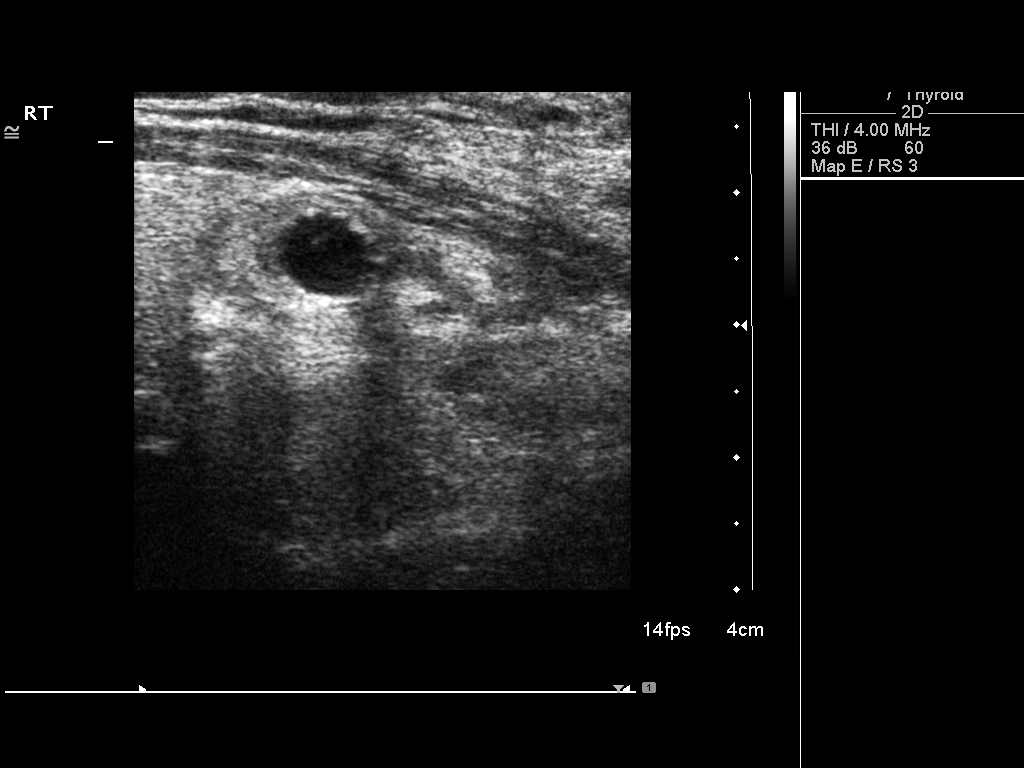

[5 of 5 positions shown; findings below may reference images not displayed]

PROCEDURE:
Thyroid biopsy was thoroughly discussed with the patient and
questions were answered. The benefits, risks, alternatives, and
complications were also discussed. The patient understands and
wishes to proceed with the procedure. Written consent was obtained.

Ultrasound was performed to localize and mark an adequate site for
the biopsy. The patient was then prepped and draped in a normal
sterile fashion. Local anesthesia was provided with 1% lidocaine.
Using direct ultrasound guidance, 4 passes were made using needles
into the nodule within the right lobe of the thyroid. Ultrasound was
used to confirm needle placements on all occasions. Specimens were
sent to Pathology for analysis.

Complications:  None
FINDINGS: Images document needle placement in the right lobe nodule.
IMPRESSION: Ultrasound guided needle aspirate biopsy performed of the right
thyroid nodule.

## 2015-12-26 ENCOUNTER — Other Ambulatory Visit: Payer: Self-pay

## 2015-12-26 MED ORDER — FLUOXETINE HCL 20 MG PO TABS: 20.0000 mg | ORAL_TABLET | Freq: Every day | ORAL | 5 refills | Status: DC

## 2016-01-15 ENCOUNTER — Encounter: Payer: Self-pay | Admitting: Internal Medicine

## 2016-01-15 ENCOUNTER — Ambulatory Visit (INDEPENDENT_AMBULATORY_CARE_PROVIDER_SITE_OTHER): Payer: BLUE CROSS/BLUE SHIELD | Admitting: Internal Medicine

## 2016-01-15 VITALS — BP 122/76 | HR 66 | Temp 97.6°F | Resp 14 | Ht 65.0 in | Wt 201.2 lb

## 2016-01-15 DIAGNOSIS — Z23 Encounter for immunization: Secondary | ICD-10-CM | POA: Diagnosis not present

## 2016-01-15 DIAGNOSIS — Z0000 Encounter for general adult medical examination without abnormal findings: Secondary | ICD-10-CM

## 2016-01-15 LAB — COMPREHENSIVE METABOLIC PANEL
ALBUMIN: 4.6 g/dL (ref 3.5–5.2)
ALK PHOS: 81 U/L (ref 39–117)
ALT: 38 U/L — AB (ref 0–35)
AST: 29 U/L (ref 0–37)
BILIRUBIN TOTAL: 0.6 mg/dL (ref 0.2–1.2)
BUN: 11 mg/dL (ref 6–23)
CALCIUM: 9.7 mg/dL (ref 8.4–10.5)
CO2: 27 mEq/L (ref 19–32)
Chloride: 104 mEq/L (ref 96–112)
Creatinine, Ser: 0.7 mg/dL (ref 0.40–1.20)
GFR: 93.06 mL/min (ref >60.00)
Glucose, Bld: 109 mg/dL — ABNORMAL HIGH (ref 70–99)
Potassium: 4.1 mEq/L (ref 3.5–5.1)
Sodium: 140 mEq/L (ref 135–145)
TOTAL PROTEIN: 7.8 g/dL (ref 6.0–8.3)

## 2016-01-15 LAB — CBC WITH DIFFERENTIAL/PLATELET
Basophils Absolute: 0 10*3/uL (ref 0.0–0.1)
Basophils Relative: 0.4 % (ref 0.0–3.0)
EOS PCT: 4 % (ref 0.0–5.0)
Eosinophils Absolute: 0.3 10*3/uL (ref 0.0–0.7)
HEMATOCRIT: 39 % (ref 36.0–46.0)
Hemoglobin: 13.6 g/dL (ref 12.0–15.0)
LYMPHS ABS: 1.4 10*3/uL (ref 0.7–4.0)
Lymphocytes Relative: 21.1 % (ref 12.0–46.0)
MCHC: 34.8 g/dL (ref 30.0–36.0)
MCV: 92.4 fl (ref 78.0–100.0)
MONOS PCT: 8.8 % (ref 3.0–12.0)
Monocytes Absolute: 0.6 10*3/uL (ref 0.1–1.0)
NEUTROS ABS: 4.4 10*3/uL (ref 1.4–7.7)
NEUTROS PCT: 65.7 % (ref 43.0–77.0)
PLATELETS: 268 10*3/uL (ref 150.0–400.0)
RBC: 4.22 Mil/uL (ref 3.87–5.11)
RDW: 12.1 % (ref 11.5–15.5)
WBC: 6.7 10*3/uL (ref 4.0–10.5)

## 2016-01-15 LAB — LIPID PANEL
Cholesterol: 236 mg/dL — ABNORMAL HIGH (ref 0–200)
HDL: 57.8 mg/dL (ref >39.00)
LDL Cholesterol: 150 mg/dL — ABNORMAL HIGH (ref 0–99)
NonHDL: 178.68
TRIGLYCERIDES: 144 mg/dL (ref 0.0–149.0)
Total CHOL/HDL Ratio: 4
VLDL: 28.8 mg/dL (ref 0.0–40.0)

## 2016-01-15 LAB — TSH: TSH: 1.58 u[IU]/mL (ref 0.35–4.50)

## 2016-01-15 MED ORDER — HYDROCORTISONE 2.5 % EX CREA: TOPICAL_CREAM | Freq: Two times a day (BID) | CUTANEOUS | 0 refills | Status: AC

## 2016-01-15 MED ORDER — NYSTATIN 100000 UNIT/GM EX OINT: 1.0000 "application " | TOPICAL_OINTMENT | Freq: Two times a day (BID) | CUTANEOUS | 0 refills | Status: DC

## 2016-01-15 MED ORDER — FLUOXETINE HCL 20 MG PO TABS: 20.0000 mg | ORAL_TABLET | Freq: Every day | ORAL | 2 refills | Status: DC

## 2016-01-15 MED ORDER — LORAZEPAM 1 MG PO TABS: 0.5000 mg | ORAL_TABLET | Freq: Every day | ORAL | 2 refills | Status: AC | PRN

## 2016-01-15 NOTE — Progress Notes (Signed)
Subjective:    Patient ID: [name] [name], female    DOB: Aug 17, 1960, 55 y.o.   MRN: [id number]  DOS:  01/15/2016 Type of visit - description : cpx Interval history:Medication list reviewed, labs reviewed.    Review of Systems Constitutional: No fever. No chills. No unexplained wt changes. No unusual sweats  HEENT: No dental problems, no ear discharge, no facial swelling, no voice changes. No eye discharge, no eye  redness , no  intolerance to light   Respiratory: No wheezing , no  difficulty breathing. No cough , no mucus production  Cardiovascular: No CP, no leg swelling , no  Palpitations  GI: For the last year, she's having more frequent bowel movements, up to 3 or 4 a day, sometimes stools are loose. Fiber and probiotics are not helping. No other symptoms, see below.  no nausea, no vomiting,  no  abdominal pain.  No blood in the stools. No dysphagia, no odynophagia    Endocrine: No polyphagia, no polyuria , no polydipsia  GU: No dysuria, gross hematuria, difficulty urinating. No urinary urgency, no frequency.  Musculoskeletal: No joint swellings or unusual aches or pains  Skin:  On and off itchy rash at the left ear lobe. Also few days history of red itchy rash under the abdominal folds. Allergic, immunologic: No environmental allergies , no  food allergies  Neurological: No dizziness no  syncope. No headaches. No diplopia, no slurred, no slurred speech, no motor deficits, no facial  Numbness  Hematological: No enlarged lymph nodes, no easy bruising , no unusual bleedings  Psychiatry: No suicidal ideas, no hallucinations, no beavior problems, no confusion.     Past Medical History:  Diagnosis Date  . Actinic keratosis 07/2014   Midline Lower Lip, treated with cryotherapy, Dr. Altamese Cabal  . Anxiety and depression   . Arthritis    hips,knees, & back  . Back pain   . Blood transfusion without reported diagnosis   . Cramp of limb   . Fatty liver    CT 12-2009    fatty change of the liver,  benign appearing 6 cm, cyst lower pole left kidney (incidental findings)  . Fibroepithelial polyp 07/2014   On neck and left axilla were surgically destroyed, Dr. Altamese Cabal  . GERD (gastroesophageal reflux disease)   . Goiter 05/2011  . Headache(784.0)   . Heart murmur   . Menometrorrhagia 01/05/08  . Perimenopausal 01/05/08  . Pneumonia    hx of  . Varicella   . Wart 07/2014   Left medial bicep, treated with cryotherapy, Dr. Altamese Cabal    Past Surgical History:  Procedure Laterality Date  . ABDOMINAL EXPOSURE N/A 11/11/2012   Procedure: Abdominal/Anterior Exposure;  Surgeon: Serafina Mitchell, MD;  Location: [facility];  Service: Vascular;  Laterality: N/A;  . adnoids  1969  . ANTERIOR LAT LUMBAR FUSION Right 11/11/2012   Procedure: Lumbar two-three, Lumbar three-four, Lumbar four-five Anterolateral decompression and Fusion ;  Surgeon: Erline Levine, MD;  Location: [place] [facility];  Service: Neurosurgery;  Laterality: Right;  Lumbar two-three, Lumbar three-four, Lumbar four-five Anterolateral decompression and Fusion   . ANTERIOR LUMBAR FUSION N/A 11/11/2012   Procedure: Lumbar five-sacral one Anterior Lumbar Interbody Fusion;  Surgeon: Erline Levine, MD;  Location: [place] [facility];  Service: Neurosurgery;  Laterality: N/A;  L5-S1 Anterior lumbar interbody fusion with Dr. Trula Slade for approach  . CESAREAN SECTION  1999  . [place]   left   . LUMBAR PERCUTANEOUS PEDICLE SCREW  4 LEVEL N/A 11/12/2012   Procedure: Thoracic Ten to Sacral One Percutaneous pedicle screw fixation with only bonegraft at the thoracolumbar junction;  Surgeon: Erline Levine, MD;  Location: [place] [facility];  Service: Neurosurgery;  Laterality: N/A;  T10 to S1 Percutaneous pedicle screw fixation with only bonegraft at the thoracolumbar junction  . TONSILLECTOMY  1969  . TRACHEOSTOMY  1968   due to severe allergic rxn   . TUBAL LIGATION  2000   bilateral    Social History   Social History  .  Marital status: Married    Spouse name: N/A  . Number of children: 1  . Years of education: N/A   Occupational History  . works from home for a [employer]    Social History Main Topics  . Smoking status: Former Smoker    Quit date: 11/09/2006  . Smokeless tobacco: Never Used     Comment: quit 2008, used to snoke 1 ppd  . Alcohol use 0.0 oz/week    3 - 4 Glasses of wine per week     Comment: social   . Drug use: No  . Sexual activity: Yes    Partners: Male    Birth control/ protection: Surgical     Comment: BTL   Other Topics Concern  . Not on file   Social History Narrative   Married, 1 daughter               Family History  Problem Relation Age of Onset  . Adopted: Yes  . Cancer Father     type?  . Cancer Mother     lung and thyroid  . Breast cancer Neg Hx   . Colon cancer Neg Hx   . Diabetes Neg Hx   . CAD Neg Hx   . Rectal cancer Neg Hx   . Stomach cancer Neg Hx        Medication List       Accurate as of 01/15/16  6:37 PM. Always use your most recent med list.          CALCIUM-MAGNESIUM-VITAMIN D PO Take 2 tablets by mouth daily. Calcium-Vitamin D-Zinc-Magnesium   FIBER CHOICE PO Take 6 capsules by mouth daily.   FLUoxetine 20 MG tablet Commonly known as:  PROZAC Take 1 tablet (20 mg total) by mouth daily.   hydrocortisone 2.5 % cream Apply topically 2 (two) times daily.   loratadine-pseudoephedrine 5-120 MG tablet Commonly known as:  CLARITIN-D 12-hour Take 1 tablet by mouth daily as needed for allergies.   LORazepam 1 MG tablet Commonly known as:  ATIVAN Take 0.5-1 tablets (0.5-1 mg total) by mouth daily as needed for anxiety or sleep.   nystatin ointment Commonly known as:  MYCOSTATIN Apply 1 application topically 2 (two) times daily.   omeprazole 20 MG tablet Commonly known as:  PRILOSEC OTC Take 20 mg by mouth daily.          Objective:   Physical Exam BP 122/76 (BP Location: Left Arm, Patient Position: Sitting, Cuff  Size: Normal)   Pulse 66   Temp 97.6 F (36.4 C) (Oral)   Resp 14   Ht 5\' 5"  (1.651 m)   Wt 201 lb 4 oz (91.3 kg)   LMP 05/02/2011   SpO2 97%   BMI 33.49 kg/m   General:   Well developed, well nourished . NAD.  Neck: + Right-sided thyromegaly, not nodular or tender HEENT:  Normocephalic . Face symmetric, atraumatic Lungs:  CTA B Normal  respiratory effort, no intercostal retractions, no accessory muscle use. Heart: RRR,  no murmur.  No pretibial edema bilaterally  Abdomen:  Not distended, soft, non-tender. No rebound or rigidity.   Skin:  Left earlobe: Dry scaly skin without redness. Under the abdominal folds she indeed has an area of redness with a few satellite lesions. Neurologic:  alert & oriented X3.  Speech normal, gait appropriate for age and unassisted Strength symmetric and appropriate for age.  Psych: Cognition and judgment appear intact.  Cooperative with normal attention span and concentration.  Behavior appropriate. No anxious or depressed appearing.    Assessment & Plan:   Assessment Anxiety depression Fatty liver CT 2011  Allergic rhinitis DJD H/o back surgery Perimenopausal , 2009 Goiter >>> bx 05-2011 and 10-2013 -->benign Skin: actinic keratosis, has seen derm H/o heart murmur CT 2011: Benign-appearing 6 cm cyst lower pole left kidney (seen again MRI 2014 before back surgery per pt, no report)  PLAN: Anxiety depression: Controlled, refill medications. Goiter, seems stable on exam Rash: We will Rx steroids for the earlobe (eczema versus allergic reaction to jewelry) and nystatin for abdominal folds, likely yeast infection. Allergies: Okay to take a OTC antihistaminics and minimize the use of decongestants. Loose stools: As described above, there is no weight loss, fever, chills. Fiber and probiotics not helping. We discussed possibly referral to GI, DDX includes IBS. She is up-to-date on colonoscopies. At this point she elected observation but  she will call if she likes GI referral.  RTC 6 months.

## 2016-01-15 NOTE — Progress Notes (Signed)
Pre visit review using our clinic review tool, if applicable. No additional management support is needed unless otherwise documented below in the visit note. 

## 2016-01-15 NOTE — Assessment & Plan Note (Signed)
Td 01-2011 , flu shot today    Female care, [facility] per gyn , Dr Leo Grosser , last OV few weeks ago Never done a DEXA CCS: Cscope 2015, 2 polyps, next per GI Labs: CMP, FLP, CBC, TSH, vitamin D. Diet exercise discussed

## 2016-01-15 NOTE — Assessment & Plan Note (Signed)
Anxiety depression: Controlled, refill medications. Goiter, seems stable on exam Rash: We will Rx steroids for the earlobe (eczema versus allergic reaction to jewelry) and nystatin for abdominal folds, likely yeast infection. Allergies: Okay to take a OTC antihistaminics and minimize the use of decongestants. Loose stools: As described above, there is no weight loss, fever, chills. Fiber and probiotics not helping. We discussed possibly referral to GI, DDX includes IBS. She is up-to-date on colonoscopies. At this point she elected observation but she will call if she likes GI referral.  RTC 6 months.

## 2016-01-15 NOTE — Patient Instructions (Signed)
GO TO THE LAB : Get the blood work     GO TO THE FRONT DESK Schedule your next appointment for a  routine checkup in 6 months  

## 2016-01-18 LAB — VITAMIN D 1,25 DIHYDROXY
Vitamin D 1, 25 (OH)2 Total: 55 pg/mL (ref 18–72)
Vitamin D3 1, 25 (OH)2: 55 pg/mL

## 2016-10-17 ENCOUNTER — Other Ambulatory Visit: Payer: Self-pay | Admitting: Internal Medicine

## 2016-10-27 ENCOUNTER — Telehealth: Payer: Self-pay | Admitting: Internal Medicine

## 2016-10-27 ENCOUNTER — Ambulatory Visit (INDEPENDENT_AMBULATORY_CARE_PROVIDER_SITE_OTHER): Payer: BLUE CROSS/BLUE SHIELD | Admitting: Internal Medicine

## 2016-10-27 ENCOUNTER — Encounter: Payer: Self-pay | Admitting: Internal Medicine

## 2016-10-27 VITALS — BP 141/77 | HR 65 | Temp 98.1°F | Ht 65.0 in | Wt 203.0 lb

## 2016-10-27 DIAGNOSIS — R739 Hyperglycemia, unspecified: Secondary | ICD-10-CM

## 2016-10-27 DIAGNOSIS — R030 Elevated blood-pressure reading, without diagnosis of hypertension: Secondary | ICD-10-CM | POA: Diagnosis not present

## 2016-10-27 MED ORDER — FLUOXETINE HCL 20 MG PO TABS: 20.0000 mg | ORAL_TABLET | Freq: Every day | ORAL | 1 refills | Status: DC

## 2016-10-27 NOTE — Progress Notes (Signed)
Subjective:    Patient ID: [name] [name], female    DOB: 1960-06-17, 56 y.o.   MRN: [id number]  DOS:  10/27/2016 Type of visit - description : acute Interval history: Patient went to donate platelets yesterday, BP was ~168/102. This morning, a nurse at work checked: 160/101. She is somewhat concerned about it. Did have a mild headache for several days, changed her prescription glasses 2 days ago, so far has not made a difference. Headache is frontal and mild.  BP Readings from Last 3 Encounters:  10/27/16 (!) 141/77  01/15/16 122/76  11/05/15 122/76     Review of Systems  Denies taking any new supplements, has not increase her salt intake, stress is at baseline. She was taken a decongestant for allergies but she stopped about a month ago, now on plain antihistaminics. No chest pain or difficulty breathing No dizziness  Past Medical History:  Diagnosis Date  . Actinic keratosis 07/2014   Midline Lower Lip, treated with cryotherapy, Dr. Altamese Cabal  . Anxiety and depression   . Arthritis    hips,knees, & back  . Back pain   . Blood transfusion without reported diagnosis   . Cramp of limb   . Fatty liver    CT 12-2009   fatty change of the liver,  benign appearing 6 cm, cyst lower pole left kidney (incidental findings)  . Fibroepithelial polyp 07/2014   On neck and left axilla were surgically destroyed, Dr. Altamese Cabal  . GERD (gastroesophageal reflux disease)   . Goiter 05/2011  . Headache(784.0)   . Heart murmur   . Menometrorrhagia 01/05/08  . Perimenopausal 01/05/08  . Pneumonia    hx of  . Varicella   . Wart 07/2014   Left medial bicep, treated with cryotherapy, Dr. Altamese Cabal    Past Surgical History:  Procedure Laterality Date  . ABDOMINAL EXPOSURE N/A 11/11/2012   Procedure: Abdominal/Anterior Exposure;  Surgeon: Serafina Mitchell, MD;  Location: [facility];  Service: Vascular;  Laterality: N/A;  . adnoids  1969  . ANTERIOR LAT LUMBAR FUSION Right 11/11/2012   Procedure: Lumbar two-three, Lumbar three-four, Lumbar four-five Anterolateral decompression and Fusion ;  Surgeon: Erline Levine, MD;  Location: [place] [facility];  Service: Neurosurgery;  Laterality: Right;  Lumbar two-three, Lumbar three-four, Lumbar four-five Anterolateral decompression and Fusion   . ANTERIOR LUMBAR FUSION N/A 11/11/2012   Procedure: Lumbar five-sacral one Anterior Lumbar Interbody Fusion;  Surgeon: Erline Levine, MD;  Location: [place] [facility];  Service: Neurosurgery;  Laterality: N/A;  L5-S1 Anterior lumbar interbody fusion with Dr. Trula Slade for approach  . CESAREAN SECTION  1999  . [place]   left   . LUMBAR PERCUTANEOUS PEDICLE SCREW 4 LEVEL N/A 11/12/2012   Procedure: Thoracic Ten to Sacral One Percutaneous pedicle screw fixation with only bonegraft at the thoracolumbar junction;  Surgeon: Erline Levine, MD;  Location: [place] [facility];  Service: Neurosurgery;  Laterality: N/A;  T10 to S1 Percutaneous pedicle screw fixation with only bonegraft at the thoracolumbar junction  . TONSILLECTOMY  1969  . TRACHEOSTOMY  1968   due to severe allergic rxn   . TUBAL LIGATION  2000   bilateral    Social History   Social History  . Marital status: Married    Spouse name: N/A  . Number of children: 1  . Years of education: N/A   Occupational History  . works from home for a [employer]    Social History Main Topics  .  Smoking status: Former Smoker    Quit date: 11/09/2006  . Smokeless tobacco: Never Used     Comment: quit 2008, used to snoke 1 ppd  . Alcohol use 0.0 oz/week    3 - 4 Glasses of wine per week     Comment: social   . Drug use: No  . Sexual activity: Yes    Partners: Male    Birth control/ protection: Surgical     Comment: BTL   Other Topics Concern  . Not on file   Social History Narrative   Married, 1 daughter                Allergies as of 10/27/2016      Reactions   Almond Extract Anaphylaxis      Medication List       Accurate as of  10/27/16 11:59 PM. Always use your most recent med list.          CALCIUM-MAGNESIUM-VITAMIN D PO Take 2 tablets by mouth daily. Calcium-Vitamin D-Zinc-Magnesium   FIBER CHOICE PO Take 6 capsules by mouth daily.   FLUoxetine 20 MG tablet Commonly known as:  PROZAC Take 1 tablet (20 mg total) by mouth daily.   hydrocortisone 2.5 % cream Apply topically 2 (two) times daily.   levocetirizine 5 MG tablet Commonly known as:  XYZAL Take 5 mg by mouth every evening.   LORazepam 1 MG tablet Commonly known as:  ATIVAN Take 0.5-1 tablets (0.5-1 mg total) by mouth daily as needed for anxiety or sleep.   nystatin ointment Commonly known as:  MYCOSTATIN Apply 1 application topically 2 (two) times daily.   omeprazole 20 MG tablet Commonly known as:  PRILOSEC OTC Take 20 mg by mouth daily.          Objective:   Physical Exam BP (!) 141/77 (BP Location: Right Arm, Patient Position: Sitting, Cuff Size: Normal)   Pulse 65   Temp 98.1 F (36.7 C) (Oral)   Ht 5\' 5"  (1.651 m)   Wt 203 lb (92.1 kg)   LMP 05/02/2011   SpO2 97%   BMI 33.78 kg/m  General:   Well developed, well nourished . NAD.  HEENT:  Normocephalic . Face symmetric, atraumatic Lungs:  CTA B Normal respiratory effort, no intercostal retractions, no accessory muscle use. Heart: RRR,  no murmur.  No pretibial edema bilaterally  Skin: Not pale. Not jaundice Neurologic:  alert & oriented X3.  Speech normal, gait appropriate for age and unassisted Psych--  Cognition and judgment appear intact.  Cooperative with normal attention span and concentration.  Behavior appropriate. No anxious or depressed appearing.      Assessment & Plan:   Assessment Anxiety depression Fatty liver CT 2011  Allergic rhinitis DJD H/o back surgery Perimenopausal , 2009 Goiter >>> bx 05-2011 and 10-2013 -->benign Skin: actinic keratosis, has seen derm H/o heart murmur CT 2011: Benign-appearing 6 cm cyst lower pole left kidney  (seen again MRI 2014 before back surgery per pt, no report)  PLAN: Elevated  BP without dx of  HTN: BP was elevated twice, today is better, 141/77 when checked by my nurse, 140/88 when I check. Has no worrisome sxs except for very mild headache. Monitor her BP is 2-3 times a week, call anytime if he is consistently elevated to start a medication. Will check a BMP, also counseled about diet and avoidance of NSAIDs. Mild hyperglycemia: A1c Loose  stools: See last visit, sxs resolved after she stopped fiber supplements. RTC  3 month

## 2016-10-27 NOTE — Patient Instructions (Addendum)
GO TO THE LAB : Get the blood work     GO TO THE FRONT DESK Schedule your next appointment for a  Check up in 3 months     Check the  blood pressure 2 or 3 times a  week   Be sure your blood pressure is between 110/65 and  145/85. If it is consistently higher or lower, let me know  Exercise daily  Low salt diet  No decongestant  No excessive ibuprofen, naproxen or similar medications

## 2016-10-27 NOTE — Telephone Encounter (Signed)
Fyi.

## 2016-10-27 NOTE — Telephone Encounter (Signed)
[facility] - Client [facility] Patient Name: [name] [name] DOB: Nov 14, 1960 Initial Comment Caller states she has elevated BP at 168/102 Nurse Assessment Nurse: Markus Daft, RN, [place] Date/Time (Eastern Time): 10/27/2016 9:49:02 AM Confirm and document reason for call. If symptomatic, describe symptoms. ---Caller states she has elevated BP at 168/102 at work this AM. HR 72 bpm per pulse ox. No s/s. Yesterday she tried to give platelets, and BP was 134/102 and they told her she couldn't give. Does the patient have any new or worsening symptoms? ---Yes Will a triage be completed? ---Yes Related visit to physician within the last 2 weeks? ---No Does the PT have any chronic conditions? (i.e. diabetes, asthma, etc.) ---No Is the patient pregnant or possibly pregnant? (Ask all females between the ages of 25-55) ---No Is this a behavioral health or substance abuse call? ---No Guidelines Guideline Title Affirmed Question Affirmed Notes High Blood Pressure Systolic BP >= 709 OR Diastolic >= 628 Final Disposition User See PCP When Office is Open (within 3 days) Markus Daft, Therapist, sports, [place] Comments Some difficulty focusing, and got new glasses 2 days ago, and states that it doesn't feel like she is used to her glasses yet. Denies blurry vision. No headache now. Appt made for today with Dr. Larose Kells at 4 pm. Disagree/Comply: Leta Baptist

## 2016-10-27 NOTE — Progress Notes (Signed)
Pre visit review using our clinic review tool, if applicable. No additional management support is needed unless otherwise documented below in the visit note. 

## 2016-10-28 LAB — BASIC METABOLIC PANEL
BUN: 15 mg/dL (ref 6–23)
CO2: 28 mEq/L (ref 19–32)
CREATININE: 0.68 mg/dL (ref 0.40–1.20)
Calcium: 9.3 mg/dL (ref 8.4–10.5)
Chloride: 105 mEq/L (ref 96–112)
GFR: 95.93 mL/min (ref >60.00)
Glucose, Bld: 94 mg/dL (ref 70–99)
Potassium: 4.2 mEq/L (ref 3.5–5.1)
Sodium: 139 mEq/L (ref 135–145)

## 2016-10-28 LAB — HEMOGLOBIN A1C: HEMOGLOBIN A1C: 5.2 % (ref 4.6–6.5)

## 2016-10-28 NOTE — Assessment & Plan Note (Signed)
Elevated  BP without dx of  HTN: BP was elevated twice, today is better, 141/77 when checked by my nurse, 140/88 when I check. Has no worrisome sxs except for very mild headache. Monitor her BP is 2-3 times a week, call anytime if he is consistently elevated to start a medication. Will check a BMP, also counseled about diet and avoidance of NSAIDs. Mild hyperglycemia: A1c Loose  stools: See last visit, sxs resolved after she stopped fiber supplements. RTC 3 month

## 2016-11-20 LAB — HM PAP SMEAR

## 2016-11-20 LAB — HM MAMMOGRAPHY

## 2016-12-03 ENCOUNTER — Encounter: Payer: Self-pay | Admitting: Internal Medicine

## 2016-12-15 ENCOUNTER — Encounter: Payer: Self-pay | Admitting: Internal Medicine

## 2016-12-15 ENCOUNTER — Ambulatory Visit (INDEPENDENT_AMBULATORY_CARE_PROVIDER_SITE_OTHER): Payer: BLUE CROSS/BLUE SHIELD | Admitting: Internal Medicine

## 2016-12-15 VITALS — BP 126/68 | HR 58 | Temp 97.8°F | Resp 14 | Ht 65.0 in | Wt 204.0 lb

## 2016-12-15 DIAGNOSIS — M5412 Radiculopathy, cervical region: Secondary | ICD-10-CM | POA: Diagnosis not present

## 2016-12-15 MED ORDER — PREDNISONE 10 MG PO TABS: ORAL_TABLET | ORAL | 0 refills | Status: DC

## 2016-12-15 MED ORDER — KETOROLAC TROMETHAMINE 10 MG PO TABS: 10.0000 mg | ORAL_TABLET | Freq: Four times a day (QID) | ORAL | 0 refills | Status: AC | PRN

## 2016-12-15 NOTE — Progress Notes (Signed)
Subjective:    Patient ID: [name] [name], female    DOB: 03-May-1960, 56 y.o.   MRN: [id number]  DOS:  12/15/2016 Type of visit - description : acute Interval history: Symptoms started  3 days ago: A burning type of pain located at the upper left back started when the patient was a stretching. The pain somewhat radiates to the posterior side of the left arm to the fourth and fifth fingers. Denies injury or fall. OTCs have so far not help much.  Review of Systems Denies fever chills No bladder or bowel incontinence No difficulty coordinating her gait No rash anywhere  Past Medical History:  Diagnosis Date  . Actinic keratosis 07/2014   Midline Lower Lip, treated with cryotherapy, Dr. Altamese Cabal  . Anxiety and depression   . Arthritis    hips,knees, & back  . Back pain   . Blood transfusion without reported diagnosis   . Cramp of limb   . Fatty liver    CT 12-2009   fatty change of the liver,  benign appearing 6 cm, cyst lower pole left kidney (incidental findings)  . Fibroepithelial polyp 07/2014   On neck and left axilla were surgically destroyed, Dr. Altamese Cabal  . GERD (gastroesophageal reflux disease)   . Goiter 05/2011  . Headache(784.0)   . Heart murmur   . Menometrorrhagia 01/05/08  . Perimenopausal 01/05/08  . Pneumonia    hx of  . Varicella   . Wart 07/2014   Left medial bicep, treated with cryotherapy, Dr. Altamese Cabal    Past Surgical History:  Procedure Laterality Date  . ABDOMINAL EXPOSURE N/A 11/11/2012   Procedure: Abdominal/Anterior Exposure;  Surgeon: Serafina Mitchell, MD;  Location: [facility];  Service: Vascular;  Laterality: N/A;  . adnoids  1969  . ANTERIOR LAT LUMBAR FUSION Right 11/11/2012   Procedure: Lumbar two-three, Lumbar three-four, Lumbar four-five Anterolateral decompression and Fusion ;  Surgeon: Erline Levine, MD;  Location: [place] [facility];  Service: Neurosurgery;  Laterality: Right;  Lumbar two-three, Lumbar three-four, Lumbar four-five Anterolateral  decompression and Fusion   . ANTERIOR LUMBAR FUSION N/A 11/11/2012   Procedure: Lumbar five-sacral one Anterior Lumbar Interbody Fusion;  Surgeon: Erline Levine, MD;  Location: [place] [facility];  Service: Neurosurgery;  Laterality: N/A;  L5-S1 Anterior lumbar interbody fusion with Dr. Trula Slade for approach  . CESAREAN SECTION  1999  . [place]   left   . LUMBAR PERCUTANEOUS PEDICLE SCREW 4 LEVEL N/A 11/12/2012   Procedure: Thoracic Ten to Sacral One Percutaneous pedicle screw fixation with only bonegraft at the thoracolumbar junction;  Surgeon: Erline Levine, MD;  Location: [place] [facility];  Service: Neurosurgery;  Laterality: N/A;  T10 to S1 Percutaneous pedicle screw fixation with only bonegraft at the thoracolumbar junction  . TONSILLECTOMY  1969  . TRACHEOSTOMY  1968   due to severe allergic rxn   . TUBAL LIGATION  2000   bilateral    Social History   Social History  . Marital status: Married    Spouse name: N/A  . Number of children: 1  . Years of education: N/A   Occupational History  . works from home for a [employer]    Social History Main Topics  . Smoking status: Former Smoker    Quit date: 11/09/2006  . Smokeless tobacco: Never Used     Comment: quit 2008, used to snoke 1 ppd  . Alcohol use 0.0 oz/week    3 - 4 Glasses of  wine per week     Comment: social   . Drug use: No  . Sexual activity: Yes    Partners: Male    Birth control/ protection: Surgical     Comment: BTL   Other Topics Concern  . Not on file   Social History Narrative   Married, 1 daughter                Allergies as of 12/15/2016      Reactions   Almond Extract Anaphylaxis      Medication List       Accurate as of 12/15/16 11:59 PM. Always use your most recent med list.          CALCIUM-MAGNESIUM-VITAMIN D PO Take 2 tablets by mouth daily. Calcium-Vitamin D-Zinc-Magnesium   FIBER CHOICE PO Take 6 capsules by mouth daily.   FLUoxetine 20 MG tablet Commonly known as:   PROZAC Take 1 tablet (20 mg total) by mouth daily.   hydrocortisone 2.5 % cream Apply topically 2 (two) times daily.   ketorolac 10 MG tablet Commonly known as:  TORADOL Take 1 tablet (10 mg total) by mouth every 6 (six) hours as needed.   levocetirizine 5 MG tablet Commonly known as:  XYZAL Take 5 mg by mouth every evening.   LORazepam 1 MG tablet Commonly known as:  ATIVAN Take 0.5-1 tablets (0.5-1 mg total) by mouth daily as needed for anxiety or sleep.   nystatin ointment Commonly known as:  MYCOSTATIN Apply 1 application topically 2 (two) times daily.   omeprazole 20 MG tablet Commonly known as:  PRILOSEC OTC Take 20 mg by mouth daily.   predniSONE 10 MG tablet Commonly known as:  DELTASONE 4 tablets x 2 days, 3 tabs x 2 days, 2 tabs x 2 days, 1 tab x 2 days            Discharge Care Instructions        Start     Ordered   12/15/16 0000  predniSONE (DELTASONE) 10 MG tablet     12/15/16 1424   12/15/16 0000  ketorolac (TORADOL) 10 MG tablet  Every 6 hours PRN     12/15/16 1424         Objective:   Physical Exam  Musculoskeletal:       Arms:  BP 126/68 (BP Location: Left Arm, Patient Position: Sitting, Cuff Size: Normal)   Pulse (!) 58   Temp 97.8 F (36.6 C) (Oral)   Resp 14   Ht 5\' 5"  (1.651 m)   Wt 204 lb (92.5 kg)   LMP 05/02/2011   SpO2 97%   BMI 33.95 kg/m  General:   Well developed, well nourished . NAD.  HEENT:  Normocephalic . Face symmetric, atraumatic Neck:  See graphic Neurologic:  alert & oriented X3.  Speech normal, gait appropriate for age and unassisted. Motor and DTRs symmetric. Psych--  Cognition and judgment appear intact.  Cooperative with normal attention span and concentration.  Behavior appropriate. No anxious or depressed appearing.      Assessment & Plan:  Assessment Anxiety depression Fatty liver CT 2011  Allergic rhinitis DJD H/o back surgery Perimenopausal , 2009 Goiter >>> bx 05-2011 and 10-2013  -->benign Skin: actinic keratosis, has seen derm H/o heart murmur CT 2011: Benign-appearing 6 cm cyst lower pole left kidney (seen again MRI 2014 before back surgery per pt, no report)  PLAN: C8 radiculopathy?: sx c/w C8 radiculopathy, motor and DTRs symmetric. Recommend conservative treatment with  prednisone, Tylenol, Toradol. Call if not improving soon. She has a history of previous spine surgery but not in that location.

## 2016-12-15 NOTE — Progress Notes (Signed)
Pre visit review using our clinic review tool, if applicable. No additional management support is needed unless otherwise documented below in the visit note. 

## 2016-12-15 NOTE — Patient Instructions (Signed)
Take prednisone as prescribed  For pain: Tylenol 500 mg one or 2 every 8 hours as needed Toradol as prescribed (do not mix with ibuprofen or naproxen)  Heat or cold  Call if not gradually improving or if symptoms severe

## 2016-12-16 NOTE — Assessment & Plan Note (Signed)
C8 radiculopathy?: sx c/w C8 radiculopathy, motor and DTRs symmetric. Recommend conservative treatment with prednisone, Tylenol, Toradol. Call if not improving soon. She has a history of previous spine surgery but not in that location.

## 2017-01-27 ENCOUNTER — Encounter: Payer: Self-pay | Admitting: Internal Medicine

## 2017-01-27 ENCOUNTER — Ambulatory Visit (INDEPENDENT_AMBULATORY_CARE_PROVIDER_SITE_OTHER): Payer: BLUE CROSS/BLUE SHIELD | Admitting: Internal Medicine

## 2017-01-27 VITALS — BP 144/78 | HR 78 | Temp 98.0°F | Resp 14 | Ht 65.0 in | Wt 207.0 lb

## 2017-01-27 DIAGNOSIS — M5412 Radiculopathy, cervical region: Secondary | ICD-10-CM

## 2017-01-27 DIAGNOSIS — Z23 Encounter for immunization: Secondary | ICD-10-CM | POA: Diagnosis not present

## 2017-01-27 DIAGNOSIS — Z0000 Encounter for general adult medical examination without abnormal findings: Secondary | ICD-10-CM | POA: Diagnosis not present

## 2017-01-27 MED ORDER — MONTELUKAST SODIUM 10 MG PO TABS: 10.0000 mg | ORAL_TABLET | Freq: Every day | ORAL | 1 refills | Status: AC

## 2017-01-27 MED ORDER — CROMOLYN SODIUM 4 % OP SOLN: 2.0000 [drp] | Freq: Two times a day (BID) | OPHTHALMIC | 3 refills | Status: AC

## 2017-01-27 NOTE — Progress Notes (Signed)
Pre visit review using our clinic review tool, if applicable. No additional management support is needed unless otherwise documented below in the visit note. 

## 2017-01-27 NOTE — Patient Instructions (Signed)
  GO TO THE FRONT DESK Schedule labs to be done fasting within the next few days  Schedule your next appointment for a physical exam in 1 year   Check the  blood pressure 2 or 3 times a month  Be sure your blood pressure is between 110/65 and  135/85. If it is consistently higher or lower, let me know   Add Singulair daily  Add cromolyn  twice a day, eyedrops

## 2017-01-27 NOTE — Progress Notes (Signed)
Subjective:    Patient ID: [name] [name], female    DOB: [birth year], 56 y.o.   MRN: [id number]  DOS:  01/27/2017 Type of visit - description : cpx Interval history: Has allergies, almost year-round, most worrisome symptom is a scratchy/itchy eyes in the mornings.  Also some nasal congestion and a scratchy throat.  Currently on Xyzal Recently seen with upper left back pain, felt to be a radiculopathy issue, s/p prednisone, it took the edge off the pain but continue with tingling at the left hand, arm and even left foot on and off.     Review of Systems  Other than above, a 14 point review of systems is negative    Past Medical History:  Diagnosis Date  . Actinic keratosis 07/2014   Midline Lower Lip, treated with cryotherapy, Dr. Altamese Cabal  . Anxiety and depression   . Arthritis    hips,knees, & back  . Back pain   . Blood transfusion without reported diagnosis   . Cramp of limb   . Fatty liver    CT 12-2009   fatty change of the liver,  benign appearing 6 cm, cyst lower pole left kidney (incidental findings)  . Fibroepithelial polyp 07/2014   On neck and left axilla were surgically destroyed, Dr. Altamese Cabal  . GERD (gastroesophageal reflux disease)   . Goiter 05/2011  . Headache(784.0)   . Heart murmur   . Menometrorrhagia 01/05/08  . Perimenopausal 01/05/08  . Pneumonia    hx of  . Varicella   . Wart 07/2014   Left medial bicep, treated with cryotherapy, Dr. Altamese Cabal    Past Surgical History:  Procedure Laterality Date  . ABDOMINAL EXPOSURE N/A 11/11/2012   Procedure: Abdominal/Anterior Exposure;  Surgeon: Serafina Mitchell, MD;  Location: [facility];  Service: Vascular;  Laterality: N/A;  . adnoids  1969  . ANTERIOR LAT LUMBAR FUSION Right 11/11/2012   Procedure: Lumbar two-three, Lumbar three-four, Lumbar four-five Anterolateral decompression and Fusion ;  Surgeon: Erline Levine, MD;  Location: [place] [facility];  Service: Neurosurgery;  Laterality: Right;  Lumbar two-three,  Lumbar three-four, Lumbar four-five Anterolateral decompression and Fusion   . ANTERIOR LUMBAR FUSION N/A 11/11/2012   Procedure: Lumbar five-sacral one Anterior Lumbar Interbody Fusion;  Surgeon: Erline Levine, MD;  Location: [place] [facility];  Service: Neurosurgery;  Laterality: N/A;  L5-S1 Anterior lumbar interbody fusion with Dr. Trula Slade for approach  . CESAREAN SECTION  1999  . [place]   left   . LUMBAR PERCUTANEOUS PEDICLE SCREW 4 LEVEL N/A 11/12/2012   Procedure: Thoracic Ten to Sacral One Percutaneous pedicle screw fixation with only bonegraft at the thoracolumbar junction;  Surgeon: Erline Levine, MD;  Location: [place] [facility];  Service: Neurosurgery;  Laterality: N/A;  T10 to S1 Percutaneous pedicle screw fixation with only bonegraft at the thoracolumbar junction  . TONSILLECTOMY  1969  . TRACHEOSTOMY  1968   due to severe allergic rxn   . TUBAL LIGATION  2000   bilateral    Social History   Social History  . Marital status: Married    Spouse name: N/A  . Number of children: 1  . Years of education: N/A   Occupational History  . works from home for a [employer]    Social History Main Topics  . Smoking status: Former Smoker    Quit date: 11/09/2006  . Smokeless tobacco: Never Used     Comment: quit 2008, used to smoke 1 ppd  .  Alcohol use 0.0 oz/week    3 - 4 Glasses of wine per week     Comment: social   . Drug use: No  . Sexual activity: Yes    Partners: Male    Birth control/ protection: Surgical     Comment: BTL   Other Topics Concern  . Not on file   Social History Narrative   Married, 1 daughter               Family History  Problem Relation Age of Onset  . Adopted: Yes  . Stomach cancer Father        comited suicide d/t cancer   . Lung cancer Mother   . Thyroid cancer Mother   . Breast cancer Paternal Aunt   . Colon cancer Neg Hx   . Diabetes Neg Hx   . CAD Neg Hx   . Rectal cancer Neg Hx      Allergies as of 01/27/2017      Reactions     Almond Extract Anaphylaxis      Medication List       Accurate as of 01/27/17 11:59 PM. Always use your most recent med list.          BIOTIN PO Take by mouth.   CALCIUM-MAGNESIUM-VITAMIN D PO Take 2 tablets by mouth daily. Calcium-Vitamin D-Zinc-Magnesium   cromolyn 4 % ophthalmic solution Commonly known as:  OPTICROM Place 2 drops into both eyes 2 (two) times daily.   FLUoxetine 20 MG tablet Commonly known as:  PROZAC Take 1 tablet (20 mg total) by mouth daily.   ketorolac 10 MG tablet Commonly known as:  TORADOL Take 1 tablet (10 mg total) by mouth every 6 (six) hours as needed.   levocetirizine 5 MG tablet Commonly known as:  XYZAL Take 5 mg by mouth every evening.   LORazepam 1 MG tablet Commonly known as:  ATIVAN Take 0.5-1 tablets (0.5-1 mg total) by mouth daily as needed for anxiety or sleep.   montelukast 10 MG tablet Commonly known as:  SINGULAIR Take 1 tablet (10 mg total) by mouth at bedtime.   omeprazole 20 MG tablet Commonly known as:  PRILOSEC OTC Take 20 mg by mouth daily.          Objective:   Physical Exam BP (!) 144/78 (BP Location: Left Arm, Patient Position: Sitting, Cuff Size: Small)   Pulse 78   Temp 98 F (36.7 C) (Oral)   Resp 14   Ht 5\' 5"  (1.651 m)   Wt 207 lb (93.9 kg)   LMP 05/02/2011   SpO2 97%   BMI 34.45 kg/m  General:   Well developed, well nourished . NAD.  Neck: Right side of the thyroid gland is slightly enlarged, smooth, nonnodular, nontender HEENT:  Normocephalic . Face symmetric, atraumatic Lungs:  CTA B Normal respiratory effort, no intercostal retractions, no accessory muscle use. Heart: RRR,  no murmur.  No pretibial edema bilaterally  Abdomen:  Not distended, soft, non-tender. No rebound or rigidity.   Skin: Exposed areas without rash. Not pale. Not jaundice Neurologic:  alert & oriented X3.  Speech normal, gait appropriate for age and unassisted Strength symmetric and appropriate for age.   Psych: Cognition and judgment appear intact.  Cooperative with normal attention span and concentration.  Behavior appropriate. No anxious or depressed appearing.     Assessment & Plan:   Assessment Anxiety depression Fatty liver CT 2011  Allergic rhinitis DJD H/o back surgery Perimenopausal , 2009  Goiter >>> bx 05-2011 and 10-2013 -->benign Skin: actinic keratosis, has seen derm H/o heart murmur CT 2011: Benign-appearing 6 cm cyst lower pole left kidney (seen again MRI 2014 before back surgery per pt, no report)  PLAN:  Anxiety depression: Continue Ativan as needed Allergic Rhinitis: Sxs not well controlled, continue antihistaminics, add Singulair and cromolyn ophthalmic solution. C8 radiculopathy?  Improved but he still has paresthesias on and off, refer to orthopedic Goiter: Exam showed increase right side of the gland but no nodularity or tenderness.  Observation for now BP slightly elevated today, recommend to check in the ambulatory setting. RTC 1 year.Marland Kitchen

## 2017-01-27 NOTE — Assessment & Plan Note (Addendum)
-  Tdap 12-2016,  flu shot today  - Female care, [facility] per gyn , Dr Leo Grosser, last OV summer 2018.  Never done a DEXA -[facility]: Cscope 2015, 2 polyps, 5 years per letter -Labs: LFTs, FLP, CBC.  Recent BMP satisfactory.  TSH was checked by gynecology in the summer. Diet and exercise discussed

## 2017-01-28 ENCOUNTER — Other Ambulatory Visit (INDEPENDENT_AMBULATORY_CARE_PROVIDER_SITE_OTHER): Payer: BLUE CROSS/BLUE SHIELD

## 2017-01-28 DIAGNOSIS — Z0000 Encounter for general adult medical examination without abnormal findings: Secondary | ICD-10-CM | POA: Diagnosis not present

## 2017-01-28 LAB — CBC WITH DIFFERENTIAL/PLATELET
BASOS ABS: 0 10*3/uL (ref 0.0–0.1)
BASOS PCT: 0.7 % (ref 0.0–3.0)
EOS ABS: 0.3 10*3/uL (ref 0.0–0.7)
Eosinophils Relative: 4.5 % (ref 0.0–5.0)
HEMATOCRIT: 40.1 % (ref 36.0–46.0)
Hemoglobin: 13.7 g/dL (ref 12.0–15.0)
LYMPHS ABS: 1.3 10*3/uL (ref 0.7–4.0)
Lymphocytes Relative: 20.2 % (ref 12.0–46.0)
MCHC: 34.2 g/dL (ref 30.0–36.0)
MCV: 96.4 fl (ref 78.0–100.0)
MONOS PCT: 9.2 % (ref 3.0–12.0)
Monocytes Absolute: 0.6 10*3/uL (ref 0.1–1.0)
NEUTROS ABS: 4.2 10*3/uL (ref 1.4–7.7)
NEUTROS PCT: 65.4 % (ref 43.0–77.0)
PLATELETS: 219 10*3/uL (ref 150.0–400.0)
RBC: 4.16 Mil/uL (ref 3.87–5.11)
RDW: 12.6 % (ref 11.5–15.5)
WBC: 6.4 10*3/uL (ref 4.0–10.5)

## 2017-01-28 LAB — LIPID PANEL
CHOL/HDL RATIO: 4
Cholesterol: 218 mg/dL — ABNORMAL HIGH (ref 0–200)
HDL: 51.3 mg/dL (ref >39.00)
LDL CALC: 127 mg/dL — AB (ref 0–99)
NONHDL: 167.09
Triglycerides: 200 mg/dL — ABNORMAL HIGH (ref 0.0–149.0)
VLDL: 40 mg/dL (ref 0.0–40.0)

## 2017-01-28 LAB — HEPATIC FUNCTION PANEL
ALK PHOS: 84 U/L (ref 39–117)
ALT: 39 U/L — ABNORMAL HIGH (ref 0–35)
AST: 27 U/L (ref 0–37)
Albumin: 4.2 g/dL (ref 3.5–5.2)
BILIRUBIN DIRECT: 0.1 mg/dL (ref 0.0–0.3)
BILIRUBIN TOTAL: 0.6 mg/dL (ref 0.2–1.2)
Total Protein: 7.3 g/dL (ref 6.0–8.3)

## 2017-01-28 NOTE — Assessment & Plan Note (Signed)
Anxiety depression: Continue Ativan as needed Allergic Rhinitis: Sxs not well controlled, continue antihistaminics, add Singulair and cromolyn ophthalmic solution. C8 radiculopathy?  Improved but he still has paresthesias on and off, refer to orthopedic Goiter: Exam showed increase right side of the gland but no nodularity or tenderness.  Observation for now BP slightly elevated today, recommend to check in the ambulatory setting. RTC 1 year.Marland Kitchen

## 2017-04-19 ENCOUNTER — Other Ambulatory Visit: Payer: Self-pay | Admitting: Internal Medicine

## 2018-02-02 ENCOUNTER — Encounter: Payer: BLUE CROSS/BLUE SHIELD | Admitting: Internal Medicine

## 2019-02-11 ENCOUNTER — Encounter: Payer: Self-pay | Admitting: Internal Medicine

## 2020-10-29 DEATH — deceased
